# Patient Record
Sex: Female | Born: 1980 | Hispanic: No | Marital: Single | Smoking: Never smoker
Health system: Southern US, Community
[De-identification: ages and names within clinical notes are randomized; demographics above are authoritative.]

## PROBLEM LIST (undated history)

## (undated) DIAGNOSIS — K219 Gastro-esophageal reflux disease without esophagitis: Secondary | ICD-10-CM

## (undated) DIAGNOSIS — A749 Chlamydial infection, unspecified: Secondary | ICD-10-CM

## (undated) DIAGNOSIS — A599 Trichomoniasis, unspecified: Secondary | ICD-10-CM

## (undated) DIAGNOSIS — A64 Unspecified sexually transmitted disease: Secondary | ICD-10-CM

## (undated) DIAGNOSIS — N912 Amenorrhea, unspecified: Secondary | ICD-10-CM

## (undated) DIAGNOSIS — B977 Papillomavirus as the cause of diseases classified elsewhere: Secondary | ICD-10-CM

## (undated) DIAGNOSIS — IMO0002 Reserved for concepts with insufficient information to code with codable children: Secondary | ICD-10-CM

## (undated) DIAGNOSIS — B999 Unspecified infectious disease: Secondary | ICD-10-CM

## (undated) HISTORY — DX: Amenorrhea, unspecified: N91.2

## (undated) HISTORY — DX: Gastro-esophageal reflux disease without esophagitis: K21.9

## (undated) HISTORY — PX: COLPOSCOPY: SHX161

## (undated) HISTORY — DX: Unspecified sexually transmitted disease: A64

## (undated) HISTORY — DX: Reserved for concepts with insufficient information to code with codable children: IMO0002

## (undated) HISTORY — PX: UPPER GASTROINTESTINAL ENDOSCOPY: SHX188

---

## 1998-03-07 ENCOUNTER — Emergency Department (HOSPITAL_COMMUNITY): Admission: EM | Admit: 1998-03-07 | Discharge: 1998-03-07 | Payer: Self-pay | Admitting: Emergency Medicine

## 1998-04-13 ENCOUNTER — Encounter: Admission: RE | Admit: 1998-04-13 | Discharge: 1998-04-13 | Payer: Self-pay | Admitting: Obstetrics

## 1998-07-10 ENCOUNTER — Emergency Department (HOSPITAL_COMMUNITY): Admission: EM | Admit: 1998-07-10 | Discharge: 1998-07-10 | Payer: Self-pay | Admitting: Emergency Medicine

## 1998-07-10 ENCOUNTER — Encounter: Payer: Self-pay | Admitting: Emergency Medicine

## 1998-08-09 ENCOUNTER — Other Ambulatory Visit: Admission: RE | Admit: 1998-08-09 | Discharge: 1998-08-09 | Payer: Self-pay | Admitting: Obstetrics and Gynecology

## 1998-09-13 ENCOUNTER — Encounter (INDEPENDENT_AMBULATORY_CARE_PROVIDER_SITE_OTHER): Payer: Self-pay | Admitting: Specialist

## 1998-09-13 ENCOUNTER — Other Ambulatory Visit: Admission: RE | Admit: 1998-09-13 | Discharge: 1998-09-13 | Payer: Self-pay | Admitting: Obstetrics and Gynecology

## 1999-01-25 ENCOUNTER — Encounter (INDEPENDENT_AMBULATORY_CARE_PROVIDER_SITE_OTHER): Payer: Self-pay

## 1999-01-25 ENCOUNTER — Other Ambulatory Visit: Admission: RE | Admit: 1999-01-25 | Discharge: 1999-01-25 | Payer: Self-pay | Admitting: Obstetrics and Gynecology

## 1999-03-19 DIAGNOSIS — R87619 Unspecified abnormal cytological findings in specimens from cervix uteri: Secondary | ICD-10-CM

## 1999-03-19 DIAGNOSIS — IMO0002 Reserved for concepts with insufficient information to code with codable children: Secondary | ICD-10-CM

## 1999-03-19 HISTORY — DX: Unspecified abnormal cytological findings in specimens from cervix uteri: R87.619

## 1999-03-19 HISTORY — DX: Reserved for concepts with insufficient information to code with codable children: IMO0002

## 1999-03-19 HISTORY — PX: CERVICAL BIOPSY  W/ LOOP ELECTRODE EXCISION: SUR135

## 1999-05-04 ENCOUNTER — Other Ambulatory Visit: Admission: RE | Admit: 1999-05-04 | Discharge: 1999-05-04 | Payer: Self-pay | Admitting: *Deleted

## 1999-05-25 ENCOUNTER — Encounter (INDEPENDENT_AMBULATORY_CARE_PROVIDER_SITE_OTHER): Payer: Self-pay

## 1999-05-25 ENCOUNTER — Other Ambulatory Visit: Admission: RE | Admit: 1999-05-25 | Discharge: 1999-05-25 | Payer: Self-pay | Admitting: *Deleted

## 1999-08-09 ENCOUNTER — Other Ambulatory Visit: Admission: RE | Admit: 1999-08-09 | Discharge: 1999-08-09 | Payer: Self-pay | Admitting: *Deleted

## 1999-11-06 ENCOUNTER — Emergency Department (HOSPITAL_COMMUNITY): Admission: EM | Admit: 1999-11-06 | Discharge: 1999-11-06 | Payer: Self-pay | Admitting: Emergency Medicine

## 2001-08-13 ENCOUNTER — Other Ambulatory Visit: Admission: RE | Admit: 2001-08-13 | Discharge: 2001-08-13 | Payer: Self-pay | Admitting: Obstetrics and Gynecology

## 2001-12-21 ENCOUNTER — Ambulatory Visit (HOSPITAL_COMMUNITY): Admission: RE | Admit: 2001-12-21 | Discharge: 2001-12-21 | Payer: Self-pay | Admitting: *Deleted

## 2002-05-25 ENCOUNTER — Other Ambulatory Visit: Admission: RE | Admit: 2002-05-25 | Discharge: 2002-05-25 | Payer: Self-pay | Admitting: Obstetrics and Gynecology

## 2002-07-08 ENCOUNTER — Encounter: Admission: RE | Admit: 2002-07-08 | Discharge: 2002-07-23 | Payer: Self-pay | Admitting: Occupational Medicine

## 2002-07-23 ENCOUNTER — Emergency Department (HOSPITAL_COMMUNITY): Admission: EM | Admit: 2002-07-23 | Discharge: 2002-07-23 | Payer: Self-pay | Admitting: Emergency Medicine

## 2002-07-30 ENCOUNTER — Encounter: Payer: Self-pay | Admitting: General Practice

## 2002-07-30 ENCOUNTER — Encounter: Admission: RE | Admit: 2002-07-30 | Discharge: 2002-07-30 | Payer: Self-pay | Admitting: General Practice

## 2002-10-14 ENCOUNTER — Other Ambulatory Visit: Admission: RE | Admit: 2002-10-14 | Discharge: 2002-10-14 | Payer: Self-pay | Admitting: Obstetrics and Gynecology

## 2003-04-19 ENCOUNTER — Encounter (INDEPENDENT_AMBULATORY_CARE_PROVIDER_SITE_OTHER): Payer: Self-pay | Admitting: *Deleted

## 2003-04-19 LAB — CONVERTED CEMR LAB

## 2003-05-14 ENCOUNTER — Inpatient Hospital Stay (HOSPITAL_COMMUNITY): Admission: AD | Admit: 2003-05-14 | Discharge: 2003-05-14 | Payer: Self-pay | Admitting: Obstetrics and Gynecology

## 2003-06-08 ENCOUNTER — Other Ambulatory Visit: Admission: RE | Admit: 2003-06-08 | Discharge: 2003-06-08 | Payer: Self-pay | Admitting: Obstetrics and Gynecology

## 2003-07-11 ENCOUNTER — Other Ambulatory Visit: Admission: RE | Admit: 2003-07-11 | Discharge: 2003-07-11 | Payer: Self-pay | Admitting: Obstetrics and Gynecology

## 2003-10-19 ENCOUNTER — Encounter: Admission: RE | Admit: 2003-10-19 | Discharge: 2003-10-19 | Payer: Self-pay | Admitting: Family Medicine

## 2003-11-14 ENCOUNTER — Emergency Department (HOSPITAL_COMMUNITY): Admission: EM | Admit: 2003-11-14 | Discharge: 2003-11-14 | Payer: Self-pay | Admitting: Family Medicine

## 2003-11-20 ENCOUNTER — Emergency Department (HOSPITAL_COMMUNITY): Admission: EM | Admit: 2003-11-20 | Discharge: 2003-11-20 | Payer: Self-pay | Admitting: Emergency Medicine

## 2003-11-22 ENCOUNTER — Emergency Department (HOSPITAL_COMMUNITY): Admission: EM | Admit: 2003-11-22 | Discharge: 2003-11-22 | Payer: Self-pay | Admitting: Family Medicine

## 2003-12-13 ENCOUNTER — Emergency Department (HOSPITAL_COMMUNITY): Admission: EM | Admit: 2003-12-13 | Discharge: 2003-12-13 | Payer: Self-pay | Admitting: Family Medicine

## 2004-01-06 ENCOUNTER — Emergency Department (HOSPITAL_COMMUNITY): Admission: EM | Admit: 2004-01-06 | Discharge: 2004-01-06 | Payer: Self-pay | Admitting: Family Medicine

## 2004-01-26 ENCOUNTER — Other Ambulatory Visit: Admission: RE | Admit: 2004-01-26 | Discharge: 2004-01-26 | Payer: Self-pay | Admitting: Obstetrics and Gynecology

## 2004-05-27 ENCOUNTER — Emergency Department (HOSPITAL_COMMUNITY): Admission: EM | Admit: 2004-05-27 | Discharge: 2004-05-27 | Payer: Self-pay | Admitting: Family Medicine

## 2004-06-22 ENCOUNTER — Emergency Department (HOSPITAL_COMMUNITY): Admission: EM | Admit: 2004-06-22 | Discharge: 2004-06-22 | Payer: Self-pay | Admitting: Family Medicine

## 2004-08-22 ENCOUNTER — Emergency Department (HOSPITAL_COMMUNITY): Admission: EM | Admit: 2004-08-22 | Discharge: 2004-08-22 | Payer: Self-pay | Admitting: Family Medicine

## 2004-09-11 ENCOUNTER — Emergency Department (HOSPITAL_COMMUNITY): Admission: EM | Admit: 2004-09-11 | Discharge: 2004-09-11 | Payer: Self-pay | Admitting: Family Medicine

## 2004-09-25 ENCOUNTER — Ambulatory Visit: Payer: Self-pay | Admitting: Family Medicine

## 2004-11-12 ENCOUNTER — Other Ambulatory Visit: Admission: RE | Admit: 2004-11-12 | Discharge: 2004-11-12 | Payer: Self-pay | Admitting: Obstetrics and Gynecology

## 2005-02-19 ENCOUNTER — Ambulatory Visit: Payer: Self-pay | Admitting: Sports Medicine

## 2005-02-21 ENCOUNTER — Ambulatory Visit: Payer: Self-pay | Admitting: Family Medicine

## 2005-02-23 ENCOUNTER — Emergency Department (HOSPITAL_COMMUNITY): Admission: EM | Admit: 2005-02-23 | Discharge: 2005-02-23 | Payer: Self-pay | Admitting: Family Medicine

## 2005-03-03 ENCOUNTER — Emergency Department (HOSPITAL_COMMUNITY): Admission: EM | Admit: 2005-03-03 | Discharge: 2005-03-03 | Payer: Self-pay | Admitting: Family Medicine

## 2005-05-04 ENCOUNTER — Emergency Department (HOSPITAL_COMMUNITY): Admission: EM | Admit: 2005-05-04 | Discharge: 2005-05-04 | Payer: Self-pay | Admitting: Family Medicine

## 2005-07-08 ENCOUNTER — Emergency Department (HOSPITAL_COMMUNITY): Admission: EM | Admit: 2005-07-08 | Discharge: 2005-07-08 | Payer: Self-pay | Admitting: Family Medicine

## 2005-11-07 ENCOUNTER — Emergency Department (HOSPITAL_COMMUNITY): Admission: EM | Admit: 2005-11-07 | Discharge: 2005-11-07 | Payer: Self-pay | Admitting: Family Medicine

## 2005-11-14 ENCOUNTER — Other Ambulatory Visit: Admission: RE | Admit: 2005-11-14 | Discharge: 2005-11-14 | Payer: Self-pay | Admitting: Obstetrics & Gynecology

## 2006-04-04 ENCOUNTER — Ambulatory Visit: Payer: Self-pay | Admitting: Family Medicine

## 2006-04-04 ENCOUNTER — Encounter: Payer: Self-pay | Admitting: Family Medicine

## 2006-04-04 LAB — CONVERTED CEMR LAB
Cholesterol: 176 mg/dL (ref 0–200)
HDL: 50 mg/dL (ref >39)
LDL Cholesterol: 108 mg/dL — ABNORMAL HIGH (ref 0–99)
Total CHOL/HDL Ratio: 3.5
Triglycerides: 90 mg/dL (ref <150)
VLDL: 18 mg/dL (ref 0–40)

## 2006-05-15 DIAGNOSIS — K219 Gastro-esophageal reflux disease without esophagitis: Secondary | ICD-10-CM | POA: Insufficient documentation

## 2006-05-15 DIAGNOSIS — K5900 Constipation, unspecified: Secondary | ICD-10-CM | POA: Insufficient documentation

## 2006-05-16 ENCOUNTER — Encounter (INDEPENDENT_AMBULATORY_CARE_PROVIDER_SITE_OTHER): Payer: Self-pay | Admitting: *Deleted

## 2006-10-20 ENCOUNTER — Telehealth: Payer: Self-pay | Admitting: *Deleted

## 2007-02-04 ENCOUNTER — Inpatient Hospital Stay (HOSPITAL_COMMUNITY): Admission: RE | Admit: 2007-02-04 | Discharge: 2007-02-07 | Payer: Self-pay | Admitting: Obstetrics and Gynecology

## 2007-04-10 ENCOUNTER — Telehealth: Payer: Self-pay | Admitting: *Deleted

## 2007-04-14 ENCOUNTER — Encounter: Payer: Self-pay | Admitting: Family Medicine

## 2007-04-14 ENCOUNTER — Ambulatory Visit: Payer: Self-pay | Admitting: Family Medicine

## 2007-04-14 DIAGNOSIS — M654 Radial styloid tenosynovitis [de Quervain]: Secondary | ICD-10-CM | POA: Insufficient documentation

## 2007-04-14 LAB — CONVERTED CEMR LAB
Bilirubin Urine: NEGATIVE
Glucose, Urine, Semiquant: NEGATIVE
Ketones, urine, test strip: NEGATIVE
Nitrite: NEGATIVE
Specific Gravity, Urine: 1.02
TSH: 0.885 microintl units/mL (ref 0.350–5.50)
Urobilinogen, UA: 0.2
pH: 6

## 2007-04-16 ENCOUNTER — Telehealth: Payer: Self-pay | Admitting: Family Medicine

## 2007-08-20 ENCOUNTER — Ambulatory Visit: Payer: Self-pay | Admitting: Family Medicine

## 2008-11-26 ENCOUNTER — Encounter (INDEPENDENT_AMBULATORY_CARE_PROVIDER_SITE_OTHER): Payer: Self-pay | Admitting: *Deleted

## 2008-11-26 DIAGNOSIS — F17200 Nicotine dependence, unspecified, uncomplicated: Secondary | ICD-10-CM | POA: Insufficient documentation

## 2009-04-06 ENCOUNTER — Ambulatory Visit: Payer: Self-pay | Admitting: Family Medicine

## 2009-05-22 ENCOUNTER — Ambulatory Visit: Payer: Self-pay | Admitting: Family Medicine

## 2009-05-22 ENCOUNTER — Encounter: Payer: Self-pay | Admitting: Family Medicine

## 2009-05-22 LAB — CONVERTED CEMR LAB
Chlamydia, DNA Probe: NEGATIVE
GC Probe Amp, Genital: NEGATIVE
Whiff Test: NEGATIVE

## 2009-06-15 ENCOUNTER — Ambulatory Visit: Payer: Self-pay | Admitting: Family Medicine

## 2009-06-15 ENCOUNTER — Encounter: Payer: Self-pay | Admitting: Family Medicine

## 2009-06-16 ENCOUNTER — Encounter: Payer: Self-pay | Admitting: Family Medicine

## 2009-06-16 ENCOUNTER — Telehealth (INDEPENDENT_AMBULATORY_CARE_PROVIDER_SITE_OTHER): Payer: Self-pay | Admitting: *Deleted

## 2009-06-19 ENCOUNTER — Telehealth: Payer: Self-pay | Admitting: Family Medicine

## 2009-06-20 ENCOUNTER — Telehealth: Payer: Self-pay | Admitting: *Deleted

## 2009-12-20 ENCOUNTER — Ambulatory Visit (HOSPITAL_COMMUNITY): Payer: Self-pay | Admitting: Psychiatry

## 2010-01-16 ENCOUNTER — Encounter: Payer: Self-pay | Admitting: Family Medicine

## 2010-03-21 ENCOUNTER — Encounter: Payer: Self-pay | Admitting: Family Medicine

## 2010-03-21 ENCOUNTER — Ambulatory Visit
Admission: RE | Admit: 2010-03-21 | Discharge: 2010-03-21 | Payer: Self-pay | Source: Home / Self Care | Attending: Family Medicine | Admitting: Family Medicine

## 2010-03-21 DIAGNOSIS — N760 Acute vaginitis: Secondary | ICD-10-CM | POA: Insufficient documentation

## 2010-03-21 LAB — CONVERTED CEMR LAB: Whiff Test: NEGATIVE

## 2010-03-22 ENCOUNTER — Telehealth: Payer: Self-pay | Admitting: Family Medicine

## 2010-04-19 NOTE — Assessment & Plan Note (Signed)
Summary: cold/?uti,tcb   Vital Signs:  Patient profile:   30 year old female Weight:      167.3 pounds Temp:     98.4 degrees F oral Pulse rate:   80 / minute Pulse rhythm:   regular BP sitting:   132 / 85  (left arm)  Vitals Entered By: Loralee Pacas CMA (June 15, 2009 1:48 PM)  Primary Care Provider:  Luretha Murphy NP   History of Present Illness: 1.  cough--started out as sore throat.  then coughing past 3-4 days.  worse at night.  no fever. no sneezing.  some nasal congestion.  no hx of allergies.  no sick contacts.  no asthma.    2.  ?uti--urinary frequency, dysuria for about a week.  no fevers or abd pain.  has had utis before  Current Medications (verified): 1)  None  Review of Systems General:  Denies fever and loss of appetite. Resp:  Complains of chest pain with inspiration and cough; denies coughing up blood, shortness of breath, and wheezing. GU:  Complains of dysuria, urinary frequency, and urinary hesitancy; denies abnormal vaginal bleeding and hematuria.  Physical Exam  General:  Well-developed,well-nourished,in no acute distress; alert,appropriate and cooperative throughout examination Eyes:  normal appearance Ears:  tms normal Nose:  External nasal examination shows no deformity or inflammation. Nasal mucosa are pink and moist without lesions or exudates. Mouth:  pharyngeal erythema.  mild.  no exudate Neck:  No deformities, masses, or tenderness noted. Lungs:  Normal respiratory effort, chest expands symmetrically. Lungs are clear to auscultation, no crackles or wheezes. Heart:  Normal rate and regular rhythm. S1 and S2 normal without gallop, murmur, click, rub or other extra sounds. Abdomen:  Bowel sounds positive,abdomen soft and non-tender without masses, organomegaly or hernias noted. Additional Exam:  vital signs reviewed    Impression & Recommendations:  Problem # 1:  DYSURIA (ICD-788.1) Assessment New  Orders: Urinalysis-[facility] (00000) [facility]- Est  Level  3 (24401) Urine Culture-[facility] (02725-36644)  Problem # 2:  VIRAL URI (ICD-465.9) Assessment: Deteriorated  benzonatate for daytime cough and tussionex for nighttime cough.  ibuprofen for pleuritic pain.    Orders: [facility]- Est Level  3 (03474)  Her updated medication list for this problem includes:    Benzonatate 200 Mg Caps (Benzonatate) .Marland Kitchen... 1 cap by mouth three times a day as needed cough    Guaifenesin-codeine 300-10 Mg/52ml Liqd (Guaifenesin-codeine) .Marland Kitchen... 5-10 ml by mouth at bedtime as needed cough; dispense qs for one month  Complete Medication List: 1)  Benzonatate 200 Mg Caps (Benzonatate) .Marland Kitchen.. 1 cap by mouth three times a day as needed cough 2)  Guaifenesin-codeine 300-10 Mg/34ml Liqd (Guaifenesin-codeine) .... 5-10 ml by mouth at bedtime as needed cough; dispense qs for one month  Patient Instructions: 1)  It was nice to see you today. 2)  For your cough, take the benzonatate during the day and the guaifensin codeine syrup at night.   3)  Ibuprofen is OK for pain (2-3 pills three times a day with food). 4)  Please schedule a follow-up appointment if you are not getting better.   Prescriptions: BENZONATATE 200 MG CAPS (BENZONATATE) 1 cap by mouth three times a day as needed cough  #90 x 0   Entered and Authorized by:   Asher Muir MD   Signed by:   Asher Muir MD on 06/15/2009   Method used:   [facility]  [address] [phone][facility] ([facility]  [address]       [place], [place]  [id number]       Ph: [phone] or [phone]       Fax: [phone]   RxID:   [id number] GUAIFENESIN-CODEINE 300-10 MG/5ML LIQD (GUAIFENESIN-CODEINE) 5-10 mL by mouth at bedtime as needed cough; dispense qs for one month  #1 x 0   Entered and Authorized by:   Asher Muir MD   Signed by:   Asher Muir MD on 06/15/2009   Method used:   Print then Give to Patient   RxID:   [id number]   Appended Document:  Urinalysis results  Laboratory Results   Urine Tests  Date/Time Received: June 15, 2009 2:04 PM  Date/Time Reported: June 15, 2009 2:16 PM   Routine Urinalysis   Color: yellow Appearance: Clear Glucose: negative   (Normal Range: Negative) Bilirubin: negative   (Normal Range: Negative) Ketone: negative   (Normal Range: Negative) Spec. Gravity: 1.020   (Normal Range: 1.003-1.035) Blood: negative   (Normal Range: Negative) pH: 7.5   (Normal Range: 5.0-8.0) Protein: trace   (Normal Range: Negative) Urobilinogen: 1.0   (Normal Range: 0-1) Nitrite: negative   (Normal Range: Negative) Leukocyte Esterace: negative   (Normal Range: Negative)    Comments: ...........test performed by...........Marland KitchenTerese Door, CMA

## 2010-04-19 NOTE — Progress Notes (Signed)
  Phone Note Outgoing Call   Call placed by: Asher Muir MD,  June 19, 2009 1:41 PM Summary of Call: called and left mssg for pt about urine culture results.  if she is still having symptoms should call me back and we can give short course of abx.  if symptoms are resolved.  then no action is needed Initial call taken by: Asher Muir MD,  June 19, 2009 1:42 PM

## 2010-04-19 NOTE — Miscellaneous (Signed)
  Clinical Lists Changes  Problems: Removed problem of CONTACT OR EXPOSURE TO OTHER VIRAL DISEASES (ICD-V01.79) Removed problem of VIRAL URI (ICD-465.9) Removed problem of WRIST PAIN, RIGHT (ICD-719.43) Removed problem of DYSURIA (ICD-788.1)

## 2010-04-19 NOTE — Assessment & Plan Note (Signed)
Summary: dizzy/fever,df   Vital Signs:  Patient profile:   30 year old female Weight:      166 pounds Temp:     98.1 degrees F oral Pulse rate:   70 / minute BP sitting:   111 / 73  (right arm) Cuff size:   regular  Vitals Entered By: Tessie Fass CMA (April 06, 2009 10:12 AM) CC: cough and congestion x 5 days. feeling dizzy x 4 days Is Patient Diabetic? No Pain Assessment Patient in pain? no        Primary Care Provider:  Luretha Murphy NP  CC:  cough and congestion x 5 days. feeling dizzy x 4 days.  History of Present Illness: cc: cough, congestion x 5 days  30 y/o F with cough, congestion x 5 days.  Pt taking Dayquil, Nitequil, and AlkaSeltzer Cold with some relief.  She is here because she is experiencing "dizziness".  No room spinning, no blurry vision, no HAs, no nausea, no vomiting, no chills.    Habits & Providers  Alcohol-Tobacco-Diet     Tobacco Status: never  Social History: Smoking Status:  never  Physical Exam  General:  Well-developed,well-nourished,in no acute distress; alert,appropriate and cooperative throughout examination. vitals reviewed.   Head:  normocephalic and atraumatic.  pt endorsed relief of pressure to bakc of head (posterior occipital area) Ears:  External ear exam shows no significant lesions or deformities.  Otoscopic examination reveals clear canals, tympanic membranes are intact bilaterally without bulging, retraction, inflammation or discharge. Hearing is grossly normal bilaterally. Mouth:  Oral mucosa and oropharynx without lesions or exudates.  Teeth in good repair. Lungs:  Normal respiratory effort, chest expands symmetrically. Lungs are clear to auscultation, no crackles or wheezes. Heart:  Normal rate and regular rhythm. S1 and S2 normal without gallop, murmur, click, rub or other extra sounds. Abdomen:  Bowel sounds positive,abdomen soft and non-tender without masses, organomegaly or hernias noted. Skin:  Intact without  suspicious lesions or rashes Cervical Nodes:  No lymphadenopathy noted   Impression & Recommendations:  Problem # 1:  VIRAL URI (ICD-465.9) Assessment New Symptoms of what pt refers to as "dizziness" is more like pressure/congestion.  Advised oct sudafed  and tylenol/ibuprofen for symptomatic relief of pain/fever.  Advised increased fluid intake.  Pt to rtc in 1 wk if not better.   Orders: [facility]- Est Level  3 (41324)  Patient Instructions: 1)  Please schedule a follow-up appointment as needed.  2)  You can try Sudafed for your head congestion. 3)  For your heartburn, please try to refrain from acidic foods (orange juice, tomatoes, coffee, etc).

## 2010-04-19 NOTE — Assessment & Plan Note (Signed)
Summary: vag discharge/eo   Vital Signs:  Patient profile:   30 year old female Height:      67 inches Weight:      179.25 pounds BMI:     28.18 Temp:     98.8 degrees F oral Pulse rate:   69 / minute BP sitting:   135 / 70  (right arm)  Vitals Entered By: Terese Door (March 21, 2010 8:49 AM) CC: vaginal discharge Is Patient Diabetic? No   Primary Care Provider:  Luretha Murphy NP  CC:  vaginal discharge.  History of Present Illness:    Vaginal discharge x 3 days, Dark yellow disharge, +pruritis,  +abd cramping, Mirena in place , no dysuria no fever, occ nausea, no emesis, appt decreased slightly, gets cramping monthly with Mirena Husband has sexual relationships outside of marriage, she does not use protection with husband  Wants STD check    Needs refill on Omeprazole   Habits & Providers  Alcohol-Tobacco-Diet     Tobacco Status: never  Current Medications (verified): 1)  Omeprazole 20 Mg Cpdr (Omeprazole) .Marland Kitchen.. 1 By Mouth Daily For Heartburn  Allergies (verified): No Known Drug Allergies  Review of Systems       Per HPI  Physical Exam  General:  Well-developed,well-nourished,in no acute distress; alert,appropriate and cooperative throughout examination Vital signs noted  Abdomen:  soft, non-tender, normal bowel sounds, no distention, and no masses.   Genitalia:  normal introitus, no external lesions, mucosa pink and moist, and no vaginal or cervical lesions.  Thick yellow tinted discharge, mild bleeding from os with swab, no CMT, no ovarian masses felt, uterus normal shape, IUD strings visable   Impression & Recommendations:  Problem # 1:  VAGINITIS (ICD-616.10) Assessment New Wet prep normal, will await, GC/Chlamydia as some white cells seen. If negative, will give option of OTC Rephresh for pH balance. The following medications were removed from the medication list:    Cipro 500 Mg Tabs (Ciprofloxacin hcl) .Marland Kitchen... 1 tab by mouth two times a day for 3  days for urinary infection; dispense generic  Orders: GC/Chlamydia-[facility] (87591/87491) Wet Prep- [facility] (21308) [facility]- Est Level  3 (65784)  Problem # 2:  SEXUALLY TRANSMITTED DISEASE, EXPOSURE TO (ICD-V01.6) Assessment: New Check GC/Chlamydia per above, HIV, RPR Orders: RPR-[facility] (69629-52841) HIV-[facility] (32440-10272) [facility]- Est Level  3 (53664)  Complete Medication List: 1)  Omeprazole 20 Mg Cpdr (Omeprazole) .Marland Kitchen.. 1 by mouth daily for heartburn  Patient Instructions: 1)  I will call regarding the remainder of your labs Prescriptions: OMEPRAZOLE 20 MG CPDR (OMEPRAZOLE) 1 by mouth daily for heartburn  #31 x 3   Entered and Authorized by:   Milinda Antis MD   Signed by:   Milinda Antis MD on 03/21/2010   Method used:   Handwritten   RxID:   [id number]    Orders Added: 1)  GC/Chlamydia-[facility] [87591/87491] 2)  Wet Prep- [facility] [87210] 3)  RPR-[facility] [43329-51884] 4)  HIV-[facility] [16606-30160] 5)  [facility]- Est Level  3 [10932]    Laboratory Results  Date/Time Received: March 21, 2010 9:11 AM  Date/Time Reported: March 21, 2010 9:44 AM   Wet Mount Source: vag WBC/hpf: 1-5 Bacteria/hpf: 3+  Rods Clue cells/hpf: none  Negative whiff Yeast/hpf: none Trichomonas/hpf: none Comments: ...............test performed by......Marland KitchenBonnie A. Swaz[place], MLS (ASCP)cm

## 2010-04-19 NOTE — Progress Notes (Signed)
Summary: Rx Prob  Phone Note Call from Patient Call back at [phone]   Caller: Patient Summary of Call: Pt calling because the cough medication that did not have a narcotic in it did not get to the pharmacy.  [facility] [address] Initial call taken by: Clydell Hakim,  June 16, 2009 8:53 AM  Follow-up for Phone Call        [facility] [address]. patient notified and she will go there to pick up. Follow-up by: Theresia Lo RN,  June 16, 2009 8:59 AM

## 2010-04-19 NOTE — Assessment & Plan Note (Signed)
Summary: STD CK,DF   Vital Signs:  Patient profile:   30 year old female Height:      67 inches Weight:      164 pounds BMI:     25.78 Temp:     98.2 degrees F oral Pulse rate:   77 / minute BP sitting:   117 / 79  (right arm) Cuff size:   regular  Vitals Entered By: Tessie Fass CMA (May 22, 2009 11:19 AM) CC: STD Check   Primary Care Provider:  Luretha Murphy NP  CC:  STD Check.  History of Present Illness: Has intercourse with estranged husband who is sexually active with other woman.  Wants to be checked for all STDs.  Physical Exam  General:  Well-developed,well-nourished,in no acute distress; alert,appropriate and cooperative throughout examination Genitalia:  Normal introitus for age, no external lesions, no vaginal discharge, mucosa pink and moist, no vaginal or cervical lesions, no vaginal atrophy, no friaility or hemorrhage, normal uterus size and position, no adnexal masses or tenderness   Impression & Recommendations:  Problem # 1:  CONTACT OR EXPOSURE TO OTHER VIRAL DISEASES (ICD-V01.79)  Orders: GC/Chlamydia-[facility] (87591/87491) Wet Prep- [facility] ([address]109) [facility]- Est Level  3 (60454)  Laboratory Results  Date/Time Received: May 22, 2009 11:30 AM  Date/Time Reported: May 22, 2009 11:45 AM   [employer] [place] Source: vaginal WBC/hpf: 1-5 Bacteria/hpf: 3+  Rods Clue cells/hpf: none  Negative whiff Yeast/hpf: none Trichomonas/hpf: none Comments: ...........test performed by...........Marland KitchenTerese Door, CMA      Prevention & Chronic Care Immunizations   Influenza vaccine: Not documented    Tetanus booster: 02/15/2005: Done.   Tetanus booster due: 02/16/2015    Pneumococcal vaccine: Not documented  Other Screening   Pap smear: Done.  (04/19/2003)   Pap smear due: 04/18/2004   Smoking status: never  (04/06/2009)

## 2010-04-19 NOTE — Assessment & Plan Note (Signed)
Summary: contact number  cell [phone].  ok to leave mssg

## 2010-04-19 NOTE — Letter (Signed)
Summary: Out of Work  [facility]  [address]   [place], [place] [address]   Phone: [phone]  Fax: [phone]    March 21, 2010   Employee:  ENYAH MOMAN    To Whom It May Concern:   For Medical reasons, please excuse the above named employee from tardiness to work for the following dates:  Start:   Mar 21, 2010  End:   Mar 21, 2010  If you need additional information, please feel free to contact our office.         Sincerely,    Milinda Antis MD

## 2010-04-19 NOTE — Progress Notes (Signed)
Summary: RX  Phone Note Call from Patient Call back at [phone]   Reason for Call: Talk to Nurse Summary of Call: pt calling to report she is still experiencing symptoms from last visit and would like antibiotics prescribed, needs to be called into [facility] [place], [place] and their number is [phone] Initial call taken by: Knox Royalty,  June 20, 2009 9:14 AM  Follow-up for Phone Call        No antibioics over he phone, give it 10 days and come in if no better. Follow-up by: Luretha Murphy NP,  June 20, 2009 10:35 AM  Additional Follow-up for Phone Call Additional follow up Details #1::        pt states she spoke with Dr. Lafonda Mosses yesterday & was told if she still has UTI symptoms, she would call antibiotics in. message to Dr. Freddy Jaksch Additional Follow-up by: Golden Circle RN,  June 20, 2009 12:32 PM    Additional Follow-up for Phone Call Additional follow up Details #2::    Her symptoms were pretty consistent with uti; so if she is still having them I would favor treatment if that is OK with Darl Pikes.  unfortunately, I cannot find her pharmacy in the system.  Would you mind sending it in?  Thanks! Follow-up by: Asher Muir MD,  June 20, 2009 12:43 PM  Additional Follow-up for Phone Call Additional follow up Details #3:: Details for Additional Follow-up Action Taken: pt informed. states [facility]. told her I will call & get it sent there. [facility] pharmacies are closed until 2pm. will try after that Additional Follow-up by: Golden Circle RN,  June 20, 2009 1:46 PM  New/Updated Medications: CIPRO 500 MG TABS (CIPROFLOXACIN HCL) 1 tab by mouth two times a day for 3 days for urinary infection; dispense generic Prescriptions: CIPRO 500 MG TABS (CIPROFLOXACIN HCL) 1 tab by mouth two times a day for 3 days for urinary infection; dispense generic  #6 x 0   Entered and Authorized by:   Asher Muir MD   Signed by:   Asher Muir MD on 06/20/2009   Method used:   Electronically to        [facility] [address] [phone]* (retail)       [address]       [place], [place]  [address]       Ph: [phone]       Fax: [phone]   RxID:   [id number]  called it in.Golden Circle RN  June 20, 2009 2:54 PM

## 2010-04-19 NOTE — Progress Notes (Signed)
Summary: Lab results-LVM- Nurse can give results  Phone Note Outgoing Call   Call placed by: Milinda Antis MD,  March 22, 2010 12:55 PM Details for Reason: Lab results Summary of Call:  LVM for pt to call back for lab results Please give patient message, her Gonorrhea/Chlamydia was negative, her HIV and Syphillis test were negative

## 2010-04-26 ENCOUNTER — Encounter: Payer: Self-pay | Admitting: *Deleted

## 2010-07-23 ENCOUNTER — Emergency Department (HOSPITAL_COMMUNITY): Payer: BC Managed Care – PPO

## 2010-07-23 ENCOUNTER — Emergency Department (HOSPITAL_COMMUNITY)
Admission: EM | Admit: 2010-07-23 | Discharge: 2010-07-24 | Disposition: A | Discharge status: Home / Self Care | Payer: BC Managed Care – PPO | Attending: Emergency Medicine | Admitting: Emergency Medicine

## 2010-07-23 DIAGNOSIS — R071 Chest pain on breathing: Secondary | ICD-10-CM | POA: Insufficient documentation

## 2010-07-23 DIAGNOSIS — Z79899 Other long term (current) drug therapy: Secondary | ICD-10-CM | POA: Insufficient documentation

## 2010-07-23 DIAGNOSIS — N644 Mastodynia: Secondary | ICD-10-CM | POA: Insufficient documentation

## 2010-07-23 DIAGNOSIS — R110 Nausea: Secondary | ICD-10-CM | POA: Insufficient documentation

## 2010-07-23 DIAGNOSIS — K219 Gastro-esophageal reflux disease without esophagitis: Secondary | ICD-10-CM | POA: Insufficient documentation

## 2010-07-23 LAB — POCT CARDIAC MARKERS: Myoglobin, poc: 41.2 ng/mL (ref 12–200)

## 2010-07-23 LAB — DIFFERENTIAL
Eosinophils Relative: 1 % (ref 0–5)
Lymphocytes Relative: 33 % (ref 12–46)
Lymphs Abs: 2.9 10*3/uL (ref 0.7–4.0)
Monocytes Absolute: 0.6 10*3/uL (ref 0.1–1.0)
Monocytes Relative: 7 % (ref 3–12)

## 2010-07-23 LAB — CBC
HCT: 37.2 % (ref 36.0–46.0)
Hemoglobin: 12.9 g/dL (ref 12.0–15.0)
MCH: 29.5 pg (ref 26.0–34.0)
MCHC: 34.7 g/dL (ref 30.0–36.0)
MCV: 84.9 fL (ref 78.0–100.0)
RDW: 13.3 % (ref 11.5–15.5)

## 2010-07-23 LAB — BASIC METABOLIC PANEL
CO2: 26 mEq/L (ref 19–32)
Calcium: 9.5 mg/dL (ref 8.4–10.5)
GFR calc Af Amer: >60 mL/min (ref >60)
Potassium: 4 mEq/L (ref 3.5–5.1)
Sodium: 138 mEq/L (ref 135–145)

## 2010-07-31 ENCOUNTER — Ambulatory Visit (INDEPENDENT_AMBULATORY_CARE_PROVIDER_SITE_OTHER): Payer: BC Managed Care – PPO | Admitting: Family Medicine

## 2010-07-31 ENCOUNTER — Encounter: Payer: Self-pay | Admitting: Family Medicine

## 2010-07-31 VITALS — BP 118/78 | HR 66 | Temp 98.4°F | Ht 67.0 in | Wt 170.0 lb

## 2010-07-31 DIAGNOSIS — Z136 Encounter for screening for cardiovascular disorders: Secondary | ICD-10-CM

## 2010-07-31 DIAGNOSIS — K219 Gastro-esophageal reflux disease without esophagitis: Secondary | ICD-10-CM

## 2010-07-31 LAB — LIPID PANEL
Cholesterol: 135 mg/dL (ref 0–200)
HDL: 36 mg/dL — ABNORMAL LOW (ref >39)
Triglycerides: 68 mg/dL (ref <150)
VLDL: 14 mg/dL (ref 0–40)

## 2010-07-31 MED ORDER — ESOMEPRAZOLE MAGNESIUM 40 MG PO CPDR: 40.0000 mg | DELAYED_RELEASE_CAPSULE | Freq: Every day | ORAL | Status: DC

## 2010-07-31 MED ORDER — OMEPRAZOLE 40 MG PO CPDR: 40.0000 mg | DELAYED_RELEASE_CAPSULE | Freq: Every day | ORAL | Status: DC

## 2010-07-31 NOTE — Progress Notes (Signed)
Addended by: Nilda Simmer on: 07/31/2010 04:35 PM   Modules accepted: Orders

## 2010-07-31 NOTE — Patient Instructions (Signed)
Begin Nexium 40 mg daily, you should notice a difference in 1-2 week I will get your lipids and send you a letter Begin to change you diet: more water( 6-8 glasses), more whole foods, less red meat Do not lay down after eating If this does not get better let me know  Diet for GERD or PUD Nutrition therapy can help ease the discomfort of gastroesophageal reflux disease (GERD) and peptic ulcer disease (PUD).  HOME CARE INSTRUCTIONS  Eat your meals slowly, in a relaxed setting.   Eat 5 to 6 small meals per day.   If a food causes distress, stop eating it for a period of time.  FOODS TO AVOID:  Coffee, regular or decaffeinated.  Cola beverages, regular or low calorie.   Tea, regular or decaffeinated.   Pepper.   Cocoa.   High fat foods including meats.   Butter, margarine, hydrogenated oil (trans fats).  Peppermint or spearmint (if you have GERD).   Fruits and vegetables as tolerated.   Alcoholic beverages.   Nicotine (smoking or chewing). This is one of the most potent stimulants to acid production in the gastrointestinal tract.   Any food that seems to aggravate your condition.   If you have questions regarding your diet, call your caregiver's office or a registered dietitian. OTHER TIPS IF YOU HAVE GERD:  Lying flat may make symptoms worse. Keep the head of your bed raised 6 to 9 inches by using a foam wedge or blocks under the legs of the bed.   Do not lay down until 3 hours after eating a meal.   Daily physical activity may help reduce symptoms.  MAKE SURE YOU:   Understand these instructions.   Will watch your condition.   Will get help right away if you are not doing well or get worse.  Document Released: 03/04/2005 Document Re-Released: 07/21/2008 [facility] Patient Information 2011 [place], [place].

## 2010-07-31 NOTE — Assessment & Plan Note (Addendum)
Common things being common will start with regular use of PPI, she has been taking omeprazole prn (Nexium preferred on her plan) and increase to 40 mg.  She will be more conscious of her po intake and if it contributes, while recognizing her diet is high is fat, sugar and carbs.  If this persists for 7-10 more days will proceed with RUQ ultrasound.  Consider depression. Labs and notes from ER were reviewed.

## 2010-07-31 NOTE — Progress Notes (Signed)
  Subjective:    Patient ID: Natalie Day, female    DOB: 10/12/1980, 30 y.o.   MRN: [id number]  HPI 2 weeks of sharp, intermittent, brief pain in the chest/right upper quadrant region.  Went to the ER last week and work up was negative for PE, MI, or abnormal labs.  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  Bad diet, sodas, coffee, fast food.  Has not gained much weight lately.  Cannot like the pain to anything she does.  Denies exertional related, occasionally occurring during the night but primarily during the day.  The ER told her to get a mammogram.   Review of Systems  Constitutional: Negative for fever, diaphoresis, activity change, appetite change, fatigue and unexpected weight change.  Respiratory: Negative for cough, shortness of breath and wheezing.   Cardiovascular: Positive for chest pain. Negative for palpitations and leg swelling.  Gastrointestinal: Positive for abdominal pain. Negative for nausea, vomiting, constipation and abdominal distention.  Genitourinary: Negative for dysuria.  Psychiatric/Behavioral: Negative for dysphoric mood.       Objective:   Physical Exam  Constitutional: She appears well-developed and well-nourished.  Pulmonary/Chest: Effort normal and breath sounds normal. She exhibits tenderness.       Very tight bra with deep underwire marks on chest wall, tender all along the costochondral margins.  Lungs clear.  Breasts no masses, very dense, no axillary adenopathy.  Abdominal: Soft. Bowel sounds are normal. She exhibits no distension. There is tenderness. There is no guarding.       + epigastric tenderness, negative Murphys  Psychiatric: She has a normal mood and affect.          Assessment & Plan:

## 2010-07-31 NOTE — Op Note (Signed)
Natalie Day, BARK NO.:  [id number]   MEDICAL RECORD NO.:  [id number]          PATIENT TYPE:  INP   LOCATION:  9124                          FACILITY:  [facility]   PHYSICIAN:  Malva Limes, M.D.    DATE OF BIRTH:  09/03/1980   DATE OF PROCEDURE:  02/04/2007  DATE OF DISCHARGE:                               OPERATIVE REPORT   PREOPERATIVE DIAGNOSES:  1. Intrauterine pregnancy at term.  2. Suspected macrosomia.   POSTOPERATIVE DIAGNOSES:  1. Intrauterine pregnancy at term.  2. Suspected macrosomia.   PROCEDURE:  Primary low transverse cesarean section.   SURGEON:  Malva Limes, M.D.   ASSISTANT:  Luvenia Redden, M.D.   ANESTHESIA:  Spinal.   ANTIBIOTICS:  Ancef 1 g.   ESTIMATED BLOOD LOSS:  900 mL.   COMPLICATIONS:  None.   FINDINGS:  Patient delivered one live, viable black female infant,  weighing 10 pounds 7 ounces.  Apgars were 9 at one minute, 9 at five  minutes.  Patient had normal fallopian tubes and ovaries bilaterally.  The uterus appeared to be normal.   DESCRIPTION OF PROCEDURE:  The patient was taken to the operating room,  where a spinal anesthetic was administered without difficulty.  She was  then placed in dorsal supine position with a left lateral tilt.  The  patient was prepped with Betadine and a Foley catheter was placed.  The  patient was then directed in the usual fashion for this procedure.  A  Pfannenstiel incision was made.  Once the parietal peritoneum was  entered, the incision was taken superiorly and inferiorly.  The bladder  flap was taken down sharply.  A low transverse uterine incision was made  in the midline.  Amniotic fluid was noted to be clear.  The infant was  delivered in the vertex presentation with a vacuum extractor.  On  delivery of the head, the oropharynx and nostrils were bulb-suctioned.  The remaining infant was then delivered.  The cord was doubly clamped  and cut and the infant handed to the waiting NICU  team.   Placenta was then manually removed.  It appeared to be normal.  The  uterus was exteriorized.  Uterine cavity was cleaned with a wet lap and  inspected.  It appeared to be normal.  The uterine incision was closed  with a single layer of 0 Monocryl suture in running, locking fashion.  The bladder flap was closed, using 2-0 Monocryl in a running fashion.  The uterus was placed back into the abdominal cavity.  Hemostasis was  checked and found to be adequate.  The parietal peritoneum and rectus  muscles were reapproximated in the midline, using 0 Monocryl suture in  running fashion.  The fascia was closed using 0 Monocryl suture in a  running fashion.  Subcuticular tissue was made hemostatic with the  Bovie.  Stainless steel clips were used to close the skin.   The patient tolerated the procedure well.  She was taken to the recovery  room in stable condition.  Instrument and lap counts were correct times  two.  ______________________________  Malva Limes, M.D.     MA/MEDQ  D:  02/04/2007  T:  02/04/2007  Job:  [id number]

## 2010-08-01 ENCOUNTER — Encounter: Payer: Self-pay | Admitting: Family Medicine

## 2010-08-03 NOTE — Discharge Summary (Signed)
NAMEWENDY, HOBACK NO.:  [id number]   MEDICAL RECORD NO.:  [id number]          PATIENT TYPE:  INP   LOCATION:  9124                          FACILITY:  [facility]   PHYSICIAN:  Carrington Clamp, M.D. DATE OF BIRTH:  06/10/1980   DATE OF ADMISSION:  02/04/2007  DATE OF DISCHARGE:  02/07/2007                               DISCHARGE SUMMARY   FINAL DIAGNOSES:  1. Intrauterine pregnancy at term.  2. Macrosomia.   PROCEDURE:  Primary low transverse cesarean section.  Surgeon:  Dr. Malva Limes.  Assistant:  Dr. Tammy Sours.  Complications:  None.   This 30 year old, G2, P0-0-1-0, presents at term for primary cesarean  section secondary to suspected macrosomia.  The patient's antepartum  course up to this point had been mostly uncomplicated.  She did have  some mild gestational thrombocytopenia that was being monitored.  She  also did have a did have a significant weight gain during this  pregnancy, and she had a normal 1-hour sugar test. Otherwise, antepartum  course had been uncomplicated.  She was admitted at this time for  cesarean section secondary to this suspected macrosomia at term.  The  patient was taken to the operating room on February 04, 2007, by Dr.  Malva Limes where primary low transverse cesarean section was  performed with the delivery of a 10 pound 7 ounce female infant with  Apgars of 9/9.  Delivery went without complications.   The patient's postoperative course was benign without any significant  fevers.  She did have gestational thrombocytopenia noted, and labs were  followed. Baby was circumcised before discharge. The patient was felt  ready for discharge on postoperative day #3. She was sent home on a  regular diet, told to decrease her activities, told to continue her  prenatal vitamins and iron supplement once daily.  She did receive Depo-  Provera before discharge but needs the next one in February. She was  given Percocet one to two  every 4 hours as needed for pain, was to  follow up in our office in 4 weeks, and instructions and precautions  were reviewed with the patient.  Labs:  On discharge, the patient had a  hemoglobin of 9.3 white blood cell count of 10.8, platelets of 86,000.      Leilani Able, P.A.-C.      Carrington Clamp, M.D.  Electronically Signed    MB/MEDQ  D:  03/06/2007  T:  03/07/2007  Job:  [id number]

## 2010-11-12 ENCOUNTER — Inpatient Hospital Stay (INDEPENDENT_AMBULATORY_CARE_PROVIDER_SITE_OTHER)
Admission: RE | Admit: 2010-11-12 | Discharge: 2010-11-12 | Disposition: A | Discharge status: Home / Self Care | Payer: BC Managed Care – PPO | Source: Ambulatory Visit | Attending: Family Medicine | Admitting: Family Medicine

## 2010-11-12 DIAGNOSIS — N739 Female pelvic inflammatory disease, unspecified: Secondary | ICD-10-CM

## 2010-11-12 LAB — POCT URINALYSIS DIP (DEVICE)
Ketones, ur: NEGATIVE mg/dL
Leukocytes, UA: NEGATIVE
Protein, ur: NEGATIVE mg/dL
Specific Gravity, Urine: 1.02 (ref 1.005–1.030)
Urobilinogen, UA: 1 mg/dL (ref 0.0–1.0)
pH: 7 (ref 5.0–8.0)

## 2010-11-12 LAB — WET PREP, GENITAL: Clue Cells Wet Prep HPF POC: NONE SEEN

## 2010-11-13 LAB — GC/CHLAMYDIA PROBE AMP, GENITAL: Chlamydia, DNA Probe: NEGATIVE

## 2010-12-25 LAB — CBC
HCT: 26.7 — ABNORMAL LOW
Hemoglobin: 9.3 — ABNORMAL LOW
Hemoglobin: 9.8 — ABNORMAL LOW
MCV: 89
Platelets: 98 — ABNORMAL LOW
RBC: 3 — ABNORMAL LOW
RBC: 3.17 — ABNORMAL LOW
WBC: 10.8 — ABNORMAL HIGH
WBC: 10.9 — ABNORMAL HIGH
WBC: 12.5 — ABNORMAL HIGH

## 2010-12-25 LAB — RPR: RPR Ser Ql: NONREACTIVE

## 2011-02-13 ENCOUNTER — Encounter: Payer: Self-pay | Admitting: Family Medicine

## 2011-02-13 ENCOUNTER — Ambulatory Visit (INDEPENDENT_AMBULATORY_CARE_PROVIDER_SITE_OTHER): Payer: BC Managed Care – PPO | Admitting: Family Medicine

## 2011-02-13 VITALS — BP 125/81 | HR 72 | Temp 98.8°F | Ht 67.0 in | Wt 172.0 lb

## 2011-02-13 DIAGNOSIS — H109 Unspecified conjunctivitis: Secondary | ICD-10-CM

## 2011-02-13 MED ORDER — BACITRACIN-POLYMYXIN B 500-10000 UNIT/GM OP OINT
TOPICAL_OINTMENT | Freq: Four times a day (QID) | OPHTHALMIC | Status: AC
Start: 2011-02-13 — End: 2011-02-20

## 2011-02-13 NOTE — Assessment & Plan Note (Addendum)
Likely viral in etiology in setting of underlying viral URI (not bronchitis). However, will cover empirically with polysporin ophthalmic given subjective purulent drainage. Discussed red flags for reevaluation including vision loss, eye pain, fever, or photophobia. Handout given. Pt agreeable to plan.

## 2011-02-13 NOTE — Progress Notes (Signed)
  Subjective:    Patient ID: Natalie Day, female    DOB: [birth year], 30 y.o.   MRN: [id number]  HPI Conjunctivitis x2 days. Patient recently returned from [place] after Thanksgiving break were patient was seen in one of the pharmacy [facility] and prescribed amoxicillin for a presumed bronchitis. Patient states she noticed eye redness yesterday. Patient states the redness is primary in the in left eye. Patient also noticed some purulent drainage of the left eye. As of this morning, patient states there was some involvement in right eye as well. Patient states that both eyes were stuck shut this morning. Patient denies any headache, eye pain, vision loss, dizziness. No fever.  Review of Systems See HPI ,otherwise 12 point ROS negative.     Objective:   Physical Exam  Eyes:         No limbic involvement bilaterally. No photophobia EOMI Visual Acuity WNL   Gen: in bed, NAD HEENT: TMs clear bilaterally, mild nasal erythema bilaterally, No cervical LAD CV: RRR PULM: CTAB, no wheezes.     Assessment & Plan:

## 2011-08-11 ENCOUNTER — Other Ambulatory Visit: Payer: Self-pay | Admitting: Family Medicine

## 2011-08-13 ENCOUNTER — Telehealth: Payer: Self-pay | Admitting: *Deleted

## 2011-08-13 NOTE — Telephone Encounter (Signed)
PA needed for Nexium. Form placed in MD box.

## 2011-08-14 ENCOUNTER — Encounter: Payer: Self-pay | Admitting: *Deleted

## 2011-08-14 NOTE — Telephone Encounter (Signed)
Approval received from insurance.  Pharmacy notified. 

## 2011-08-14 NOTE — Telephone Encounter (Signed)
This encounter was created in error - please disregard.

## 2011-08-14 NOTE — Telephone Encounter (Signed)
Form completed by Dr. Alvester Morin and faxed to insurance.

## 2011-09-17 ENCOUNTER — Ambulatory Visit (INDEPENDENT_AMBULATORY_CARE_PROVIDER_SITE_OTHER): Payer: BC Managed Care – PPO | Admitting: Family Medicine

## 2011-09-17 ENCOUNTER — Encounter: Payer: Self-pay | Admitting: Family Medicine

## 2011-09-17 VITALS — BP 124/82 | HR 69 | Ht 67.0 in | Wt 179.0 lb

## 2011-09-17 DIAGNOSIS — G44209 Tension-type headache, unspecified, not intractable: Secondary | ICD-10-CM

## 2011-09-17 NOTE — Patient Instructions (Signed)
Dear Mrs. Derryl Harbor,   Thank you for coming to clinic today. Please read below regarding the issues that we discussed.   Headaches - I suggest taking over the counter medication like advil or tylenol. Also, please start a headache journal that record tthe time and date of headaches and what your were doing when they occurred, how long it lasted, and what needed to be done to make it go away.   Please follow up in clinic in 6 weeks . Please call earlier if you have any questions or concerns.   Sincerely,   Dr. Clinton Sawyer

## 2011-09-17 NOTE — Progress Notes (Signed)
  Subjective:    Patient ID: Natalie Day, female    DOB: Mar 29, 1980, 31 y.o.   MRN: [id number]  HPI  HEADACHE: Location:multiple sites - left frontal and right occipital   Quality:sharp, shocking lasting a few seconds;   Onset: first noticed a few months ago, happens infrequently but 3-4 times in one day this weekend  Duration of headache without treatment: Current Frequency:a few times a month  Longest duration without headache: long duration, never had a migraine  Accompanying symptoms: no changes in vision, no nausea, no vomiting, no photophobia, no phonophobia, no lacrimation, no rhinorrhea, yes worsened with activity, no neurologic symptoms Effective treatment:none  Ineffective treatment:none  History of headaches: infrequent  History of imaging:none  Known triggers:none  History of headache journal: no  Suspected Etiology: 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     Review of Systems See HPI     Objective:   Physical Exam BP 124/82  Pulse 69  Ht 5\' 7"  (1.702 m)  Wt 179 lb (81.194 kg)  BMI 28.04 kg/m2 Gen: alert and oriented, well appearing, non-distressed female HEENT: NCAT, no tenderness to palpation along scalp; EOMI, PERRLA, OP clear and moist MSK: normal ROM of cervical spine, no rigidity or meningismus  CRANIAL NERVES: 2 (Optic): visual fields intact to confrontation; funduscopic exam quality diminished by lack of dilation  3,4,6 (EOM): pupils equal round and reactive; extra ocular movement intact 5 (Trigeminal):  no facial numbness; muscles of mastication are intact 7 (Facial): no facial weakness or asymmetry  8 (Auditory): intact 9,10 (Glossopharyngeal/Vagus): uvula is intact midline; palate elevate symmetrically 11 (Spinal Accessory): normal sternocleidomastoid and trapezius strength 12 (Hypoglossal) tongue is midline; no fasciculations or atrophy  MOTOR: Muscle Strength: Upper Extremity: 5/5, Grip: 5/5 Muscle Tone:  normal  REFLEXES: DTR 2+ biceps tendon bilaterally        Assessment & Plan:  31 year old with primary headache.

## 2011-09-18 DIAGNOSIS — G44209 Tension-type headache, unspecified, not intractable: Secondary | ICD-10-CM | POA: Insufficient documentation

## 2011-09-18 NOTE — Assessment & Plan Note (Signed)
Liklyl tension type headache exacerbated by stress of her divorce and work. It has some characteristics like stabbing pain that we may find in cluster headaches, but there is not the temporal association or other characteristics. She was offered Ketoralac for treatment of a primary headache, but refused. She will try OTC analgesic and keep and headache diary. F/u in 1 month.

## 2011-09-27 ENCOUNTER — Telehealth: Payer: Self-pay | Admitting: Emergency Medicine

## 2011-09-27 DIAGNOSIS — R51 Headache: Secondary | ICD-10-CM

## 2011-09-27 NOTE — Telephone Encounter (Signed)
Placed referral to neurology order.

## 2011-09-27 NOTE — Telephone Encounter (Signed)
Asking for a referral to headache specialist. She stated that she has been seen several times her and thru the ED for this and would now like to have a second opinion.Natalie [name]

## 2011-10-01 ENCOUNTER — Encounter: Payer: Self-pay | Admitting: Emergency Medicine

## 2011-10-01 ENCOUNTER — Ambulatory Visit (INDEPENDENT_AMBULATORY_CARE_PROVIDER_SITE_OTHER): Payer: BC Managed Care – PPO | Admitting: Emergency Medicine

## 2011-10-01 VITALS — BP 122/74 | HR 71 | Ht 67.0 in | Wt 178.0 lb

## 2011-10-01 DIAGNOSIS — G44209 Tension-type headache, unspecified, not intractable: Secondary | ICD-10-CM

## 2011-10-01 NOTE — Telephone Encounter (Signed)
Pt called and informed.Natalie Day [place]  

## 2011-10-01 NOTE — Progress Notes (Signed)
  Subjective:    Patient ID: Natalie Day, female    DOB: October 28, 1980, 31 y.o.   MRN: [id number]  HPI Natalie Day is here for a SDA for headache.  Reports daily headache for last month or so located in forehead, occasionally throbbing, no nausea or photophobia, occur randomly, can be present on awakening.  Also has intermittent shooting pains in temples and occipital areas.  These last a few seconds and occur 2-3 times a day for the last month.  She has had these earlier this year, but had resolved.  Does report that she has a lot going on both at home and at work right now.  Denies having anyone to really talk to about all her concerns.  I have reviewed and updated the following as appropriate: allergies and current medications  Review of Systems See HPI    Objective:   Physical Exam BP 122/74  Pulse 71  Ht 5\' 7"  (1.702 m)  Wt 178 lb (80.74 kg)  BMI 27.88 kg/m2 Gen: alert, cooperative, did become tearful when discussing stressors HEENT: AT/[place], sclera white EOMI, PERRL, MMM, no pharyngeal erythema or exudate Neck: no LAD, supple CV: RRR, no murmurs Pulm: CTAB, no wheezes or rales Ext: no edema Neuro: CN II-XII intact bilaterally, strength 5/5 in all extremities, 2+ patellar reflexes bilaterally      Assessment & Plan:

## 2011-10-01 NOTE — Patient Instructions (Signed)
I'm sorry you are having headaches.  These are likely related to your stress level.  Please go to the website [email or web address]  You can do a search for providers that take your insurance and have experience in stress management.  Follow up with me if the headaches get worse, change, or you have further questions.

## 2011-10-01 NOTE — Assessment & Plan Note (Signed)
No red flags.  Discussed using tylenol and/or motrin for pain relief.  Discussed talking with counselor about stress in life; patient agreeable; handout of area therapist given.  Follow up in headaches worsen or change.

## 2011-12-11 ENCOUNTER — Encounter: Payer: Self-pay | Admitting: Family Medicine

## 2011-12-11 ENCOUNTER — Ambulatory Visit (INDEPENDENT_AMBULATORY_CARE_PROVIDER_SITE_OTHER): Payer: BC Managed Care – PPO | Admitting: Family Medicine

## 2011-12-11 VITALS — BP 136/71 | HR 82 | Temp 98.5°F | Ht 67.0 in | Wt 182.0 lb

## 2011-12-11 DIAGNOSIS — G5600 Carpal tunnel syndrome, unspecified upper limb: Secondary | ICD-10-CM

## 2011-12-11 MED ORDER — AMITRIPTYLINE HCL 25 MG PO TABS: 25.0000 mg | ORAL_TABLET | Freq: Every day | ORAL | Status: DC

## 2011-12-11 MED ORDER — IBUPROFEN 800 MG PO TABS: 800.0000 mg | ORAL_TABLET | Freq: Three times a day (TID) | ORAL | Status: DC | PRN

## 2011-12-11 NOTE — Patient Instructions (Signed)
I don't think that the knot in your hand is causing you pain.  I think it might be a component of carpal tunnel syndrome.   Wear the wrist splint at night to help relieve the pressure on the nerve.   Take the Ibuprofen regularly every 8 hours for pain and to reduce the inflammation.   Take the Amitriptyline 25 mg each night to help with the tingling.     Make an appointment to [facility] for an [facility] of your wrist.    Carpal Tunnel Syndrome The carpal tunnel is a narrow hollow area in the wrist. It is formed by the wrist bones and ligaments. Nerves, blood vessels, and tendons (cord like structures which attach muscle to bone) on the palm side (the side of your hand in the direction your fingers bend) of your hand pass through the carpal tunnel. Repeated wrist motion or certain diseases may cause swelling within the tunnel. (That is why these are called repetitive trauma (damage caused by over use) disorders. It is also a common problem in late pregnancy.) This swelling pinches the main nerve in the wrist (median nerve) and causes the painful condition called carpal tunnel syndrome. A feeling of "pins and needles" may be noticed in the fingers or hand; however, the entire arm may ache from this condition. Carpal tunnel syndrome may clear up by itself. Cortisone injections may help. Sometimes, an operation may be needed to free the pinched nerve. An electromyogram (a type of test) may be needed to confirm this diagnosis (learning what is wrong). This is a test which measures nerve conduction. The nerve conduction is usually slowed in a carpal tunnel syndrome. HOME CARE INSTRUCTIONS   If your caregiver prescribed medication to help reduce swelling, take as directed.   If you were given a splint to keep your wrist from bending, use it as instructed. It is important to wear the splint at night. Use the splint for as long as you have pain or numbness in your hand, arm or  wrist. This may take 1 to 2 months.   If you have pain at night, it may help to rub or shake your hand, or elevate your hand above the level of your heart (the center of your chest).   It is important to give your wrist a rest by stopping the activities that are causing the problem. If your symptoms (problems) are work-related, you may need to talk to your employer about changing to a job that does not require using your wrist.   Only take over-the-counter or prescription medicines for pain, discomfort, or fever as directed by your caregiver.   Following periods of extended use, particularly strenuous use, apply an ice pack wrapped in a towel to the anterior (palm) side of the affected wrist for 20 to 30 minutes. Repeat as needed three to four times per day. This will help reduce the swelling.   Follow all instructions for follow-up with your caregiver. This includes any orthopedic referrals, physical therapy, and rehabilitation. Any delay in obtaining necessary care could result in a delay or failure of your condition to heal.  SEEK IMMEDIATE MEDICAL CARE IF:   You are still having pain and numbness following a week of treatment.   You develop new, unexplained symptoms.   Your current symptoms are getting worse and are not helped or controlled with medications.  MAKE SURE YOU:   Understand these instructions.   Will watch your condition.  Will get help right away if you are not doing well or get worse.  Document Released: 03/01/2000 Document Revised: 02/21/2011 Document Reviewed: 01/18/2011 [facility] Patient Information 2012 [place], [place].

## 2011-12-11 NOTE — Progress Notes (Signed)
  Subjective:    Patient ID: Natalie Day, female    DOB: 09-23-1980, 31 y.o.   MRN: [id number]  HPI  1.  Left hand and forearm pain:  Patient states this started about 2 weeks ago, gradually increased in severity for several days, and then stopped.  Returned again yesterday evening.  Awoke her from sleep.  Took 3 tablets of 200 mg Ibuprofen without any relief.  Describes pain as alternating between "electrical currents" and numbness with tingling.  No change when driving.  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    Also, she found a "knot" in her hand and wonders if this is causing her pain.  No inciting injuries, no trauma.  No bruising at site.    Review of Systems See HPI above for review of systems.       Objective:   Physical Exam Gen:  Alert, cooperative patient who appears stated age in no acute distress.  Vital signs reviewed. Neck: No masses or thyromegaly or limitation in range of motion.  No cervical lymphadenopathy.  Spurling's negative MSK:  Strength and sensation 5/5 BL upper extremities, handgrip.  No paresthesias or numbness upon palpation.   Hand:  4 mm nodule located at base of 4th MCP joint.  Mildly TTP.         Assessment & Plan:

## 2011-12-11 NOTE — Assessment & Plan Note (Signed)
Plan to treat with Ibuprofen to relieve inflammation and pain.  Cock-up splint to be worn at night.  Elavil 25 mg for neuropathic pain relief, drowsy precautions provided. [facility] to ensure this is source of her pain and possible further treatment options at that time.

## 2011-12-16 ENCOUNTER — Ambulatory Visit (INDEPENDENT_AMBULATORY_CARE_PROVIDER_SITE_OTHER): Payer: BC Managed Care – PPO | Admitting: Family Medicine

## 2011-12-16 VITALS — BP 130/70 | Ht 67.0 in | Wt 180.0 lb

## 2011-12-16 DIAGNOSIS — M25539 Pain in unspecified wrist: Secondary | ICD-10-CM

## 2011-12-16 DIAGNOSIS — M6530 Trigger finger, unspecified finger: Secondary | ICD-10-CM

## 2011-12-16 DIAGNOSIS — M65342 Trigger finger, left ring finger: Secondary | ICD-10-CM

## 2011-12-16 NOTE — Progress Notes (Signed)
  Subjective:    Patient ID: Natalie Day, female    DOB: 27-May-1980, 31 y.o.   MRN: [id number]  HPI  2 weeks of progressive left wrist and hand pain. She noted a nodule in the palmar portion of her left hand about 2 weeks ago. Initially it was a bothering her much. Since then she started to have diffuse left hand and wrist pain it seems to be progressing upper to her elbow and even to her shoulder at times. Pain is diffuse. Not worse with any specific motion. She's had no hand weakness, no hand numbness. She types almost all day long Pertinent past medical history: Right-hand dominant. No history of left wrist or hand injury and no history of left hand surgery. Review of Systems Denies fever, sweats, chills. Has noted no skin rash or skin color change of her left hand.    Objective:   Physical Exam No acute distress WRISTS: Left. Full range of motion in extension and flexion. Negative Tinel. Negative Phalen. Supination is painless in her strength at the elbow is intact. Distally she is neurovascularly intact. HAND: Left. Fourth finger flexor tendon has a small nodule that is tender to palpation. Just over the A1 pulley. It moves when the ring finger moves. ULTRASOUND: Cystlike structure seen underneath the flexor tendon. The flexor tendon itself is intact and easily identified. PROCEDURE:INJECTION: Patient was given informed consent, signed copy in the chart. Appropriate time out was taken. Area prepped and draped in usual sterile fashion. 2 cc of    1% lidocaine without epinephrine was injected into the Area of the aPlml immediately above the nodule. We waited 2-3 minutes and then One half cc of 40 mg per cc methylprednisolone and 1/2 cc of 1% lidocaine was injected directly into the nodule using a perpendicular approach. . The patient tolerated the procedure well. There were no complications. Post procedure instructions were given.        Assessment & Plan:  #1. Ring finger trigger. We  discussed options such as conservative watch and wait, corticosteroid injection which she opted for today. Ultimately if this does not resolve her issues I refer her to hand surgery. I would wait 2-3 weeks to see if this is significantly improved. I don't think the splint is going to help her. #2. I did ultrasound her median nerve at the wrist on the left. It appears perfectly normal. I think her wrist and forearm pain is referred pain

## 2011-12-26 ENCOUNTER — Ambulatory Visit (INDEPENDENT_AMBULATORY_CARE_PROVIDER_SITE_OTHER): Payer: BC Managed Care – PPO | Admitting: Family Medicine

## 2011-12-26 ENCOUNTER — Encounter: Payer: Self-pay | Admitting: Family Medicine

## 2011-12-26 VITALS — BP 116/68 | Temp 99.0°F | Ht 67.0 in | Wt 180.7 lb

## 2011-12-26 DIAGNOSIS — R059 Cough, unspecified: Secondary | ICD-10-CM

## 2011-12-26 DIAGNOSIS — R05 Cough: Secondary | ICD-10-CM

## 2011-12-26 MED ORDER — GUAIFENESIN-CODEINE 200-20 MG/5ML PO LIQD: 5.0000 mL | Freq: Every day | ORAL | Status: DC

## 2011-12-26 MED ORDER — BENZONATATE 100 MG PO CAPS: 100.0000 mg | ORAL_CAPSULE | Freq: Three times a day (TID) | ORAL | Status: DC | PRN

## 2011-12-26 NOTE — Progress Notes (Signed)
Patient ID: Larene Ascencio, female   DOB: September 22, 1980, 31 y.o.   MRN: [id number] Subjective: The patient is a 31 y.o. year old female who presents today for cough.  Cough present for 3 days.  Worse at night.  Relatively non-productive.  No fevers/chills.  Other than cough, no symptoms.  No other sick contacts.  Non-smoker, not around smokers.  Up to date on immunizations.  No rhinorrhea.  Started with a bit of a sore throat.  Patient's past medical, social, and family history were reviewed and updated as appropriate. History  Substance Use Topics  . Smoking status: Never Smoker   . Smokeless tobacco: Never Used  . Alcohol Use: Not on file   Objective:  Filed Vitals:   12/26/11 1015  BP: 116/68  Temp: 99 F (37.2 C)   Gen: NAD HEENT: MMM, EOMI, TM normal bilaterally, no adenopathy, pharynx non-erythematous, no exudates CV: RRR Resp: CTABL  Assessment/Plan: Viral cough/pharyngitis: Symptomatic treatment only.  At this time symptoms have not been present long enough to be concerning for pertusis and there is no evidence of PNA or strep throat.  Please also see individual problems in problem list for problem-specific plans.

## 2011-12-26 NOTE — Patient Instructions (Signed)
It was good to see you today! I am giving you a prescription for Tessalon which is a pill you take up to 3 times daily for your cough. I am also giving you a cough syrup with codeine in it.  Only take it at night and be aware that if you have a random drug test in the week following taking it, you could end up testing positive for codeine.  If your job gives you any problems about this, have them contact us.

## 2012-03-15 ENCOUNTER — Encounter (HOSPITAL_COMMUNITY): Payer: Self-pay | Admitting: Family

## 2012-03-15 ENCOUNTER — Inpatient Hospital Stay (HOSPITAL_COMMUNITY)
Admission: AD | Admit: 2012-03-15 | Discharge: 2012-03-15 | Disposition: A | Discharge status: Home / Self Care | Payer: BC Managed Care – PPO | Source: Ambulatory Visit | Attending: Obstetrics and Gynecology | Admitting: Obstetrics and Gynecology

## 2012-03-15 DIAGNOSIS — E860 Dehydration: Secondary | ICD-10-CM | POA: Insufficient documentation

## 2012-03-15 DIAGNOSIS — M791 Myalgia, unspecified site: Secondary | ICD-10-CM

## 2012-03-15 DIAGNOSIS — R109 Unspecified abdominal pain: Secondary | ICD-10-CM

## 2012-03-15 HISTORY — DX: Papillomavirus as the cause of diseases classified elsewhere: B97.7

## 2012-03-15 HISTORY — DX: Chlamydial infection, unspecified: A74.9

## 2012-03-15 HISTORY — DX: Unspecified infectious disease: B99.9

## 2012-03-15 HISTORY — DX: Trichomoniasis, unspecified: A59.9

## 2012-03-15 LAB — URINALYSIS, ROUTINE W REFLEX MICROSCOPIC
Glucose, UA: NEGATIVE mg/dL
Ketones, ur: NEGATIVE mg/dL
Leukocytes, UA: NEGATIVE
pH: 6 (ref 5.0–8.0)

## 2012-03-15 LAB — URINE MICROSCOPIC-ADD ON

## 2012-03-15 LAB — WET PREP, GENITAL
Trich, Wet Prep: NONE SEEN
Yeast Wet Prep HPF POC: NONE SEEN

## 2012-03-15 NOTE — MAU Note (Addendum)
Pt c/o of abd and back pain tha is like a burning pain. Menstrual  like cramping but pt has IUD in and does not have periods. Reports she has a amonia smell coming from her vagina. Had odor a few months ago and Md gave her some medication (flagyl?) and it went away.

## 2012-03-15 NOTE — MAU Provider Note (Signed)
History     CSN: [id number]  Arrival date and time: 03/15/12 1138   First Provider Initiated Contact with Patient 03/15/12 1230      Chief Complaint  Patient presents with  . Abdominal Pain   HPI Ms. Natalie Day is a 31 y.o. G2 P0011 who presents to MAU today with complaint of lower abdominal cramping. The patient denies vaginal bleeding or abnormal discharge. The patient has an IUD for birth control and does not have periods with this. She has a history of STIs. She also states that she has a new sexual partner and although they use protection she is concerned that the pain could be from an infection. The patient also states that she just began a new exercise regimen in the last couple of days. The patient denies urinary symptoms and fever.   OB History    Grav Para Term Preterm Abortions TAB SAB Ect Mult Living   2    1     1       Past Medical History  Diagnosis Date  . Infection   . Trichimoniasis   . HPV (human papilloma virus) infection   . Chlamydia     Past Surgical History  Procedure Date  . Cesarean section     History reviewed. No pertinent family history.  History  Substance Use Topics  . Smoking status: Never Smoker   . Smokeless tobacco: Never Used  . Alcohol Use: No    Allergies: No Known Allergies  Prescriptions prior to admission  Medication Sig Dispense Refill  . ibuprofen (ADVIL,MOTRIN) 800 MG tablet Take 1 tablet (800 mg total) by mouth every 8 (eight) hours as needed for pain.  30 tablet  0  . NEXIUM 40 MG capsule TAKE 1 CAPSULE (40 MG TOTAL) BY MOUTH DAILY BEFORE BREAKFAST.  30 capsule  2    ROS All negative unless otherwise noted in HPI Physical Exam   Blood pressure 117/64, pulse 81, temperature 98.8 F (37.1 C), temperature source Oral, resp. rate 18, height 5\' 7"  (1.702 m), weight 178 lb 6.4 oz (80.922 kg).  Physical Exam  Constitutional: She is oriented to person, place, and time. She appears well-developed and well-nourished.  No distress.  HENT:  Head: Normocephalic.  Cardiovascular: Normal rate, regular rhythm and normal heart sounds.   Respiratory: Effort normal and breath sounds normal. No respiratory distress.  GI: Soft. Bowel sounds are normal. She exhibits no distension and no mass. There is tenderness (mild diffuse tenderness to palpation of the abdomen ). There is no rebound and no guarding.  Genitourinary: Vagina normal and uterus normal. Cervix exhibits discharge (very small amount of white, mucus-like discharge noted. Wet prep and GC/CT obtained). Cervix exhibits no motion tenderness and no friability. Right adnexum displays no mass and no tenderness. Left adnexum displays no mass and no tenderness. No vaginal discharge found.  Neurological: She is alert and oriented to person, place, and time.  Skin: Skin is warm and dry. No erythema.  Psychiatric: She has a normal mood and affect.   Results for orders placed during the hospital encounter of 03/15/12 (from the past 24 hour(s))  URINALYSIS, ROUTINE W REFLEX MICROSCOPIC     Status: Abnormal   Collection Time   03/15/12 11:45 AM      Component Value Range   Color, Urine YELLOW  YELLOW   APPearance CLEAR  CLEAR   Specific Gravity, Urine >1.030 (*) 1.005 - 1.030   pH 6.0  5.0 - 8.0  Glucose, UA NEGATIVE  NEGATIVE mg/dL   Hgb urine dipstick TRACE (*) NEGATIVE   Bilirubin Urine SMALL (*) NEGATIVE   Ketones, ur NEGATIVE  NEGATIVE mg/dL   Protein, ur NEGATIVE  NEGATIVE mg/dL   Urobilinogen, UA 0.2  0.0 - 1.0 mg/dL   Nitrite NEGATIVE  NEGATIVE   Leukocytes, UA NEGATIVE  NEGATIVE  URINE MICROSCOPIC-ADD ON     Status: Abnormal   Collection Time   03/15/12 11:45 AM      Component Value Range   Squamous Epithelial / LPF FEW (*) RARE   RBC / HPF 0-2  <3 RBC/hpf   Bacteria, UA FEW (*) RARE   Urine-Other MUCOUS PRESENT    POCT PREGNANCY, URINE     Status: Normal   Collection Time   03/15/12 11:58 AM      Component Value Range   Preg Test, Ur NEGATIVE   NEGATIVE  WET PREP, GENITAL     Status: Abnormal   Collection Time   03/15/12 12:32 PM      Component Value Range   Yeast Wet Prep HPF POC NONE SEEN  NONE SEEN   Trich, Wet Prep NONE SEEN  NONE SEEN   Clue Cells Wet Prep HPF POC NONE SEEN  NONE SEEN   WBC, Wet Prep HPF POC MANY (*) NONE SEEN    MAU Course  Procedures None  UA showed signs of dehydration, but no signs of UTI Wet prep did not show signs of infection GC/Chlamydia pending  Assessment and Plan  A: Mild dehydration Muscle aches  P: Discharge patient Recommend NSAIDs OTC PRN for pain Patient should follow-up with Dr. Dareen Piano as needed/scheduled  Freddi Starr, PA-C 03/15/2012, 1:40 PM

## 2012-03-15 NOTE — MAU Note (Signed)
Patient reports lower abdominal pain for last couple of weeks. New sexual partner for about same time. Has Mirena contraceptive for 4 years. Denies vaginal discharge/bleeding/or pain with intercourse.

## 2012-03-16 LAB — GC/CHLAMYDIA PROBE AMP: CT Probe RNA: NEGATIVE

## 2012-04-16 ENCOUNTER — Encounter: Payer: Self-pay | Admitting: Family Medicine

## 2012-04-16 ENCOUNTER — Other Ambulatory Visit (HOSPITAL_COMMUNITY)
Admission: RE | Admit: 2012-04-16 | Discharge: 2012-04-16 | Disposition: A | Discharge status: Home / Self Care | Payer: BC Managed Care – PPO | Source: Ambulatory Visit | Attending: Family Medicine | Admitting: Family Medicine

## 2012-04-16 ENCOUNTER — Ambulatory Visit (INDEPENDENT_AMBULATORY_CARE_PROVIDER_SITE_OTHER): Payer: BC Managed Care – PPO | Admitting: Family Medicine

## 2012-04-16 VITALS — BP 133/70 | HR 55 | Temp 98.9°F | Ht 67.0 in | Wt 176.5 lb

## 2012-04-16 DIAGNOSIS — Z113 Encounter for screening for infections with a predominantly sexual mode of transmission: Secondary | ICD-10-CM | POA: Insufficient documentation

## 2012-04-16 DIAGNOSIS — N760 Acute vaginitis: Secondary | ICD-10-CM

## 2012-04-16 LAB — POCT WET PREP (WET MOUNT): Clue Cells Wet Prep Whiff POC: NEGATIVE

## 2012-04-16 NOTE — Patient Instructions (Addendum)
It was nice to meet you today, Natalie Day. Please refer to handout below regarding common causes of vaginitis. It typically takes about 1-2 days for results to return. If today's lab results are ABNORMAL, we will call you and send medication to your pharmacy.   If you develop worsening symptoms, fever (temperature > 101.5 degrees), nausea/vomiting, or pelvic pain, please return to clinic.  What is vaginitis? Vaginitis is an inflammation of the vagina. Vulvovaginitis refers to inflammation of both the vagina and vulva (the external female genitals).   What are the most common types of vaginitis? The six most common types of vaginitis are:  Candida or "yeast" vaginitis  Bacterial vaginosis  Trichomoniasis vaginitis (a sexually transmitted infection)   What are candida or "yeast" infections? Yeast infections of the vagina are what most women think of when they hear the term "vaginitis." Yeast infections are caused by one of the many species of fungus called candida. Candida normally live in small numbers in the vagina, as well as in the mouth and digestive tract of both men and women.  Yeast infections produce a thick, white vaginal discharge with the consistency of cottage cheese. Although the discharge can be somewhat watery, it is odorless. Yeast infections usually cause the vagina and the vulva to be very itchy and red, even before the onset of discharge.  If yeast is normal in a woman's vagina, what makes it cause an infection? Usually, infection occurs when a change in the delicate balance in a woman's system takes place.   What is bacterial vaginosis? Although "yeast" is the name most women know, bacterial vaginosis (BV) actually is the most common vaginal infection in women of reproductive age. Bacterial vaginosis often will cause a vaginal discharge. The discharge usually is thin and milky, and is described as having a "fishy" odor. This odor may become more noticeable after intercourse.   Redness or itching of the vagina are not common symptoms of bacterial vaginosis. Some women with BV have no symptoms at all, and the vaginitis is only discovered during a routine gynecologic exam. Bacterial vaginosis is caused by a combination of several bacteria. These bacteria seem to overgrow in much the same way as do candida when the vaginal pH balance is upset.  Because bacterial vaginosis is caused by bacteria and not by yeast, medicine that is appropriate for yeast is not effective against the bacteria that cause bacterial vaginosis.  BV is treated in asymptomatic females of child bearing age to decrease their risk of preterm delivery.  Risk factors for BV include:  New or multiple sexual partners  Douching  Cigarette smoking  What is trichomoniasis? Trichomoniasis - Trichomoniasis is caused by a tiny single-celled organism known as a "protozoa." When this organism infects the vagina, it can cause a frothy, greenish-yellow discharge. Often this discharge will have a foul smell. Women with trichomonal vaginitis may complain of itching and soreness of the vagina and vulva, as well as burning during urination. In addition, there can be discomfort in the lower abdomen and vaginal pain with intercourse. These symptoms may be worse after the menstrual period. Many women, however, do not develop any symptoms. It is important to understand that this type of vaginitis can be transmitted through sexual intercourse. For treatment to be effective, the sexual partner must be treated at the same time as the patient.

## 2012-04-16 NOTE — Progress Notes (Signed)
  Subjective:    Patient ID: Natalie Day, female    DOB: 11-16-1980, 32 y.o.   MRN: [id number]  HPI  Patient presents to same day clinic for vaginal discharge.  Symptoms started about 4 days ago.  Discharge is described white, thick without odor.  Complains of vaginal itching labia.  Denies any dysuria or difficulty urinating.  Denies any vaginal bleeding.   Does complain of intermittent pelvic and low back pain that has been going on for about one month.  Denies any nausea or vomiting or fevers.  Normal appetite.  Has had yeast infection several years ago.  Sexual history: currently active with one partner.  Has a Mirena for birth control.  Review of Systems  Per HPI    Objective:   Physical Exam  Constitutional: She appears well-nourished. No distress.  Abdominal: Soft. Bowel sounds are normal. She exhibits no distension. There is no tenderness. There is no rebound and no guarding.  Genitourinary: Uterus normal. There is no rash, tenderness or lesion on the right labia. There is no rash, tenderness or lesion on the left labia. Cervix exhibits discharge. Cervix exhibits no motion tenderness and no friability. No erythema, tenderness or bleeding around the vagina. Vaginal discharge found.  Musculoskeletal:       No CVA tenderness      Assessment & Plan:

## 2012-04-16 NOTE — Addendum Note (Signed)
Addended by: Farrell Ours on: 04/16/2012 01:28 PM   Modules accepted: Orders

## 2012-04-16 NOTE — Assessment & Plan Note (Signed)
Copious amount of thick vaginal discharge on exam.  Will check wet prep, GC/chlamydia today and notify of results.  Patient not interested in HIV or RPR testing at his time.  Follow up as needed.

## 2012-07-20 ENCOUNTER — Other Ambulatory Visit: Payer: Self-pay | Admitting: *Deleted

## 2012-07-21 MED ORDER — ESOMEPRAZOLE MAGNESIUM 40 MG PO CPDR: DELAYED_RELEASE_CAPSULE | ORAL | Status: DC

## 2012-10-16 ENCOUNTER — Ambulatory Visit (HOSPITAL_COMMUNITY)
Admission: RE | Admit: 2012-10-16 | Discharge: 2012-10-16 | Disposition: A | Discharge status: Home / Self Care | Payer: BC Managed Care – PPO | Source: Ambulatory Visit | Attending: Emergency Medicine | Admitting: Emergency Medicine

## 2012-10-16 ENCOUNTER — Other Ambulatory Visit (HOSPITAL_COMMUNITY)
Admission: RE | Admit: 2012-10-16 | Discharge: 2012-10-16 | Disposition: A | Discharge status: Home / Self Care | Payer: BC Managed Care – PPO | Source: Ambulatory Visit | Attending: Family Medicine | Admitting: Family Medicine

## 2012-10-16 ENCOUNTER — Encounter: Payer: Self-pay | Admitting: Family Medicine

## 2012-10-16 ENCOUNTER — Ambulatory Visit (INDEPENDENT_AMBULATORY_CARE_PROVIDER_SITE_OTHER): Payer: BC Managed Care – PPO | Admitting: Family Medicine

## 2012-10-16 VITALS — BP 121/82 | HR 52 | Ht 67.0 in | Wt 168.0 lb

## 2012-10-16 DIAGNOSIS — I499 Cardiac arrhythmia, unspecified: Secondary | ICD-10-CM

## 2012-10-16 DIAGNOSIS — I498 Other specified cardiac arrhythmias: Secondary | ICD-10-CM | POA: Insufficient documentation

## 2012-10-16 DIAGNOSIS — Z113 Encounter for screening for infections with a predominantly sexual mode of transmission: Secondary | ICD-10-CM | POA: Insufficient documentation

## 2012-10-16 LAB — HIV ANTIBODY (ROUTINE TESTING W REFLEX): HIV: NONREACTIVE

## 2012-10-16 LAB — RPR

## 2012-10-16 LAB — POCT WET PREP (WET MOUNT)

## 2012-10-16 MED ORDER — ESOMEPRAZOLE MAGNESIUM 40 MG PO CPDR: DELAYED_RELEASE_CAPSULE | ORAL | Status: DC

## 2012-10-16 NOTE — Patient Instructions (Addendum)
It was nice to meet you today!  I will call you if your test results are not normal.  Otherwise, I will send you a letter.  If you do not hear from me with in 2 weeks please call our office.     Come back if the bleeding or cramping gets worse.  Schedule an appointment within the next month to see Dr. Elwyn Reach and discuss your acid reflux medicine.  Be well, Dr. Pollie Meyer

## 2012-10-16 NOTE — Progress Notes (Signed)
Patient ID: Saragrace Selke, female   DOB: 07-17-1980, 32 y.o.   MRN: [id number]    HPI:  STD testing: patient recently became sexually active after 5 months of abstinence. Has a new female partner. Previously had just one other female partner within the past year. Has had some spotting and cramping since becoming sexually active recently (within last week). Has Mirena IUD in place, and is due to get it removed in September because it will have been in for 5 years. Is thinking about having another Mirena put in at that time, because she has been pleased with it. The spotting and cramping did not occur prior to recently becoming sexually active again. Denies vaginal burning, pain, or discharge. Has hx of chlamydia about 10 years ago, which was treated. No other hx of STD's. She and her partner use condoms. 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   ROS: See HPI. Denies chest pain, palpitations, shortness of breath, fevers, night sweats, weight loss.  PMFSH: sexually active with one female partner currently.  PHYSICAL EXAM: BP 121/82  Pulse 52  Ht 5\' 7"  (1.702 m)  Wt 168 lb (76.204 kg)  BMI 26.31 kg/m2 Gen: NAD Heart: irregular rhythm, normal rate Lungs: CTAB Neuro: grossly nonfocal, speech intact GU: Normal appearing vaginal discharge present in vault. No lesions seen on vulva. IUD strings are visualized hanging from the cervical os. No tenderness on bimanual exam. No adnexal masses appreciable. No cervical motion tenderness.  ASSESSMENT/PLAN:  # STD screening: - will do wet prep, gc/chlamydia, HIV/RPR today - counseled patient on importance of partner also being tested, and using condoms to prevent STD transmission  # Refill - pt requested refill on Nexium. Agreed to refill one month's worth, but patient needs to schedule appt with her PCP (Dr. Elwyn Reach) to follow up on GERD as we did not address this issue today.  #  Cramping/spotting: likely secondary to recently resuming intercourse. No abnormalities on exam today. IUD in position (strings visualized). F/u if not worsening.

## 2012-10-17 DIAGNOSIS — I499 Cardiac arrhythmia, unspecified: Secondary | ICD-10-CM | POA: Insufficient documentation

## 2012-10-17 NOTE — Assessment & Plan Note (Signed)
Noted on exam today. Patient is completely asymptomatic. EKG done in clinic and shows marked sinus arrhythmia with sinus bradycardia. As pt asymptomatic, will not pursue additional workup at this time.

## 2012-10-21 ENCOUNTER — Encounter: Payer: Self-pay | Admitting: Family Medicine

## 2012-11-05 ENCOUNTER — Other Ambulatory Visit: Payer: Self-pay | Admitting: Obstetrics and Gynecology

## 2012-12-08 ENCOUNTER — Other Ambulatory Visit: Payer: Self-pay | Admitting: Emergency Medicine

## 2012-12-08 MED ORDER — ESOMEPRAZOLE MAGNESIUM 40 MG PO CPDR: DELAYED_RELEASE_CAPSULE | ORAL | Status: DC

## 2012-12-18 ENCOUNTER — Encounter: Payer: Self-pay | Admitting: Nurse Practitioner

## 2012-12-18 ENCOUNTER — Ambulatory Visit (INDEPENDENT_AMBULATORY_CARE_PROVIDER_SITE_OTHER): Payer: BC Managed Care – PPO | Admitting: Gynecology

## 2012-12-18 ENCOUNTER — Encounter: Payer: Self-pay | Admitting: Gynecology

## 2012-12-18 VITALS — BP 112/60 | HR 82 | Resp 20 | Ht 66.5 in | Wt 165.0 lb

## 2012-12-18 DIAGNOSIS — Z9889 Other specified postprocedural states: Secondary | ICD-10-CM

## 2012-12-18 DIAGNOSIS — Z0000 Encounter for general adult medical examination without abnormal findings: Secondary | ICD-10-CM

## 2012-12-18 DIAGNOSIS — Z202 Contact with and (suspected) exposure to infections with a predominantly sexual mode of transmission: Secondary | ICD-10-CM

## 2012-12-18 DIAGNOSIS — Z975 Presence of (intrauterine) contraceptive device: Secondary | ICD-10-CM | POA: Insufficient documentation

## 2012-12-18 DIAGNOSIS — J029 Acute pharyngitis, unspecified: Secondary | ICD-10-CM

## 2012-12-18 DIAGNOSIS — Z9189 Other specified personal risk factors, not elsewhere classified: Secondary | ICD-10-CM

## 2012-12-18 HISTORY — DX: Other specified postprocedural states: Z98.890

## 2012-12-18 LAB — POCT URINALYSIS DIPSTICK
Ketones, UA: NEGATIVE
Nitrite, UA: NEGATIVE
Urobilinogen, UA: NEGATIVE
pH, UA: 7

## 2012-12-18 MED ORDER — AMOXICILLIN-POT CLAVULANATE 875-125 MG PO TABS: 1.0000 | ORAL_TABLET | Freq: Two times a day (BID) | ORAL | Status: DC

## 2012-12-18 MED ORDER — FLUCONAZOLE 150 MG PO TABS: 150.0000 mg | ORAL_TABLET | Freq: Once | ORAL | Status: DC

## 2012-12-18 NOTE — Patient Instructions (Addendum)
  Place sexually transmitted diseases screening patient instructions here.  Thank you for enrolling in MyChart. Please follow the instructions below to securely access your online medical record. MyChart allows you to send messages to your doctor, view your test results, manage appointments, and more.   How Do I Sign Up? 1. In your Internet browser, go to [employer] and enter [email or web address]. 2. Click on the Sign Up Now link in the Sign In box. You will see the New Member Sign Up page. 3. Enter your MyChart Access Code exactly as it appears below. You will not need to use this code after you've completed the sign-up process. If you do not sign up before the expiration date, you must request a new code. MyChart Access Code: [email or web address] Expires: 02/16/2013 10:43 AM  4. Enter your Social Security Number ([id number]) and Date of Birth (mm/dd/yyyy) as indicated and click Submit. You will be taken to the next sign-up page. 5. Create a MyChart ID. This will be your MyChart login ID and cannot be changed, so think of one that is secure and easy to remember. 6. Create a MyChart password. You can change your password at any time. 7. Enter your Password Reset Question and Answer. This can be used at a later time if you forget your password.  8. Enter your e-mail address. You will receive e-mail notification when new information is available in MyChart. 9. Click Sign Up. You can now view your medical record.   Additional Information Remember, MyChart is NOT to be used for urgent needs. For medical emergencies, dial 911.

## 2012-12-18 NOTE — Progress Notes (Signed)
  Subjective:    Natalie Day is a 32 y.o. female who presents for sexually transmitted disease check. Sexual history reviewed with the patient. STI Exposure: new partner using condoms but reports had oral sex and now has a sore throat.  This occurred a week ago but the throat issues started the next day.. Previous history of STI chlamydia, trichomonas, 10y ago.. Current symptoms vaginal discharge: denies, pelvic pain: not present, fever, oral lesions. Contraception: IUD Menstrual History: OB History   Grav Para Term Preterm Abortions TAB SAB Ect Mult Living   2 1 1  1     1       Patient's last menstrual period was 03/18/2006.    The following portions of the patient's history were reviewed and updated as appropriate: allergies, current medications, past family history, past medical history, past social history, past surgical history and problem list.  Review of Systems Pertinent items are noted in HPI.    Objective:    BP 112/60  Pulse 82  Resp 20  Ht 5' 6.5" (1.689 m)  Wt 165 lb (74.844 kg)  BMI 26.24 kg/m2  LMP 03/18/2006 General:   alert, cooperative and appears stated age  Lymph Nodes:   Cervical adenopathy: negative and Inguinal adenopathy: negative  Pelvis:  External genitalia: normal general appearance Vaginal: normal without tenderness, induration or masses and thick dark mucoid blood Cervix: normal appearance, GC prep obtained, presence of blood from cervical os, IUD string visualized and no tenderness Adnexa: normal bimanual exam Uterus: normal single, nontender and anteverted  Cultures:  GC and Chlamydia genprobes and oral and genital, strept culture done    oral:  White exudate noted on right tonsillar area, min PND Assessment:    Possible STD exposure    Plan:    Discussed safe sexual practice in detail See orders for STD cultures and assays Will call pt with results  Will start treatment for possible strept throat Pt due for annual in January, wil  address other issues at that time, expect IUD due for removal?  Pt recalls placed 1y after son's birth, will get records from prior gyn and pathology regarding LEEP in 2001 to triage PAP needs- pt agrees.  Pt aware that IUD only good for 5y and may not be contraceptive at this time

## 2012-12-18 NOTE — Addendum Note (Signed)
Addended by: Douglass Rivers on: 12/18/2012 02:20 PM   Modules accepted: Orders

## 2012-12-19 LAB — RAPID STREP SCREEN (MED CTR MEBANE ONLY): Streptococcus, Group A Screen (Direct): NEGATIVE

## 2012-12-20 ENCOUNTER — Encounter: Payer: Self-pay | Admitting: Gynecology

## 2012-12-20 LAB — CULTURE, GROUP A STREP

## 2012-12-21 ENCOUNTER — Telehealth: Payer: Self-pay | Admitting: Gynecology

## 2012-12-21 NOTE — Telephone Encounter (Signed)
Patient is callin for results from testing she had done she said some are showing on mychart but she didn't know if the rest had came in.

## 2012-12-21 NOTE — Telephone Encounter (Signed)
Spoke with pt to advise the STD screens have not come back yet. Pt aware of neg strep cultures. Advised we will call her when other results are available. Pt agreeable.

## 2012-12-22 ENCOUNTER — Encounter: Payer: Self-pay | Admitting: Gynecology

## 2012-12-24 LAB — GC/CHLAMYDIA PROBE, AMP (THROAT): Chlamydia trachomatis RNA: NOT DETECTED

## 2013-01-21 ENCOUNTER — Other Ambulatory Visit: Payer: Self-pay

## 2013-10-13 ENCOUNTER — Other Ambulatory Visit: Payer: Self-pay | Admitting: Family Medicine

## 2013-11-01 ENCOUNTER — Other Ambulatory Visit: Payer: Self-pay | Admitting: *Deleted

## 2013-11-01 DIAGNOSIS — K219 Gastro-esophageal reflux disease without esophagitis: Secondary | ICD-10-CM

## 2013-11-01 MED ORDER — ESOMEPRAZOLE MAGNESIUM 40 MG PO CPDR: DELAYED_RELEASE_CAPSULE | ORAL | Status: DC

## 2013-12-08 ENCOUNTER — Encounter: Payer: Self-pay | Admitting: Family Medicine

## 2013-12-08 ENCOUNTER — Ambulatory Visit (INDEPENDENT_AMBULATORY_CARE_PROVIDER_SITE_OTHER): Payer: BC Managed Care – PPO | Admitting: Family Medicine

## 2013-12-08 VITALS — BP 105/71 | HR 67 | Temp 98.6°F | Ht 66.5 in | Wt 184.0 lb

## 2013-12-08 DIAGNOSIS — R05 Cough: Secondary | ICD-10-CM

## 2013-12-08 DIAGNOSIS — R059 Cough, unspecified: Secondary | ICD-10-CM | POA: Diagnosis not present

## 2013-12-08 MED ORDER — GUAIFENESIN-CODEINE 100-10 MG/5ML PO SOLN: 10.0000 mL | Freq: Three times a day (TID) | ORAL | Status: DC | PRN

## 2013-12-08 NOTE — Patient Instructions (Signed)
Nice to meet you. Please try the cough syrup for your cough. If your cough worsens, you develop chest pain or shortness of breath, or fever please seek medical attention.

## 2013-12-08 NOTE — Assessment & Plan Note (Signed)
Likely viral cough at this time, though could be related to her GERD. No abnormal lung sounds to indicate PNA or asthma. I discussed this with the patient and patient felt this was not related to her reflux at this time and chose to proceed with symptomatic treatment for now. Given Rx for guaifenesin-codeine. Given return precautions. If does not improve would consider changing treatment for GERD.

## 2013-12-08 NOTE — Progress Notes (Signed)
Patient ID: Eriyana Sweeten, female   DOB: 1980-10-18, 33 y.o.   MRN: [id number]  Marikay Alar, MD Phone: [phone]  Lana Flaim is a 33 y.o. female who presents today for same day appointment.  Cough: symptoms started 2 weeks ago. She had mild post nasal drip at that time. She notes mild productive cough since that time. She denies nasal congestion, ear fullness, and fever. She notes a burning sensation in the center of her chest that started recently and is associated with her cough. She denies radiation of the burning sensation, diaphoresis, reflux symptoms, sour taste in her mouth. She has tried OTC robitussin and cough drops with minimal benefit. She would like something to help her cough less. She notes she is on nexium for her reflux.  Patient is a nonsmoker.   ROS: Per HPI   Physical Exam Filed Vitals:   12/08/13 1143  BP: 105/71  Pulse: 67  Temp: 98.6 F (37 C)    Gen: Well NAD HEENT: PERRL,  MMM, no OP erythema, normal TMs bilaterally, no cervical LAD Lungs: CTABL Nl WOB Heart: RRR no MRG Abd: s, NT, ND Exts: Non edematous BL  LE, warm and well perfused.    Assessment/Plan: Please see individual problem list.   Marikay Alar, MD [facility] PGY-3

## 2014-01-17 ENCOUNTER — Encounter: Payer: Self-pay | Admitting: Family Medicine

## 2014-09-05 ENCOUNTER — Encounter: Payer: Self-pay | Admitting: Family Medicine

## 2014-09-05 ENCOUNTER — Ambulatory Visit: Payer: BC Managed Care – PPO | Admitting: Family Medicine

## 2014-09-05 ENCOUNTER — Ambulatory Visit (INDEPENDENT_AMBULATORY_CARE_PROVIDER_SITE_OTHER): Payer: BC Managed Care – PPO | Admitting: Family Medicine

## 2014-09-05 VITALS — BP 150/80 | Temp 98.5°F | Ht 66.5 in | Wt 191.2 lb

## 2014-09-05 DIAGNOSIS — R0602 Shortness of breath: Secondary | ICD-10-CM | POA: Diagnosis not present

## 2014-09-05 MED ORDER — OMEPRAZOLE 40 MG PO CPDR: 40.0000 mg | DELAYED_RELEASE_CAPSULE | Freq: Every day | ORAL | Status: DC

## 2014-09-05 NOTE — Progress Notes (Signed)
   Subjective:    Patient ID: Natalie Day, female    DOB: 27-Sep-1980, 34 y.o.   MRN: [id number]  HPI 34 year old female presents for same day appointment complaints of a sensation of something in her throat/difficulty breathing.  Patient reports that she was at work earlier this morning and developed a sensation that something was stuck in her throat (she felt as if she was choking) and had subsequent difficulty breathing.  A coworker called EMS and she was quickly evaluated. Her vital signs were stable and she was not taken to the emergency room.  Given her symptoms, she decided to come in this morning for evaluation. She reports that his happened 2 additional times since leaving her office this morning. No known inciting factors. No exacerbating factors. She reports that her symptoms resolved quickly within seconds.  She is currently symptomatic.  Of note, patient does have a long-standing history of reflux. Additionally, she has been under significant stress at home and at work. She has no past medical history of cardiac disease or lung disease.  Social Hx - Nonsmoker.   Review of Systems  Constitutional: Negative.   Respiratory: Positive for choking and shortness of breath.   Cardiovascular: Negative for chest pain and palpitations.      Objective:   Physical Exam Filed Vitals:   09/05/14 1132  BP: 150/80  Temp: 98.5 F (36.9 C)   Vital signs reviewed.  Exam: General: well appearing female in no acute distress. HEENT: NCAT. Oropharynx clear. Neck: Trachea midline. No masses noted. Cardiovascular: RRR. No murmurs, rubs, or gallops. Respiratory: CTAB. No rales, rhonchi, or wheeze.    Assessment & Plan:  See Problem List

## 2014-09-05 NOTE — Patient Instructions (Signed)
It was nice to see you today. Please take the medication as prescribed the next month. Follow-up closely with her primary next 2 weeks to one month. Please follow-up earlier if this continues to occur. Take care  Dr. Adriana Simas

## 2014-09-05 NOTE — Assessment & Plan Note (Signed)
With associated feeling as if something is stuck in her throat/choking. Differential diagnosis -GERD versus panic attack versus allergic reaction. I favor the diagnosis of GERD. Allergic reaction unlikely given that symptoms resolved within seconds. Patient is currently taking reflux medicine as needed. I sent a new prescription and informed her to take it daily for the next 30 days.   Patient follow-up closely with primary if this continues or fails to improve.

## 2014-11-03 ENCOUNTER — Other Ambulatory Visit: Payer: Self-pay | Admitting: *Deleted

## 2014-11-03 MED ORDER — OMEPRAZOLE 40 MG PO CPDR: 40.0000 mg | DELAYED_RELEASE_CAPSULE | Freq: Every day | ORAL | Status: DC

## 2015-04-25 ENCOUNTER — Encounter (HOSPITAL_COMMUNITY): Payer: Self-pay | Admitting: Emergency Medicine

## 2015-04-25 ENCOUNTER — Emergency Department (INDEPENDENT_AMBULATORY_CARE_PROVIDER_SITE_OTHER)
Admission: EM | Admit: 2015-04-25 | Discharge: 2015-04-25 | Disposition: A | Discharge status: Home / Self Care | Payer: Self-pay | Source: Home / Self Care | Attending: Family Medicine | Admitting: Family Medicine

## 2015-04-25 DIAGNOSIS — M545 Low back pain: Secondary | ICD-10-CM

## 2015-04-25 MED ORDER — NAPROXEN 500 MG PO TABS: 500.0000 mg | ORAL_TABLET | Freq: Two times a day (BID) | ORAL | Status: DC

## 2015-04-25 NOTE — ED Notes (Signed)
Pt d/c by frank patrick, pa 
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
## 2015-04-25 NOTE — Discharge Instructions (Signed)
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
## 2016-11-07 ENCOUNTER — Encounter: Payer: Self-pay | Admitting: Nurse Practitioner

## 2016-11-21 ENCOUNTER — Encounter: Payer: Self-pay | Admitting: Nurse Practitioner

## 2016-11-21 ENCOUNTER — Ambulatory Visit (INDEPENDENT_AMBULATORY_CARE_PROVIDER_SITE_OTHER): Payer: BC Managed Care – PPO | Admitting: Nurse Practitioner

## 2016-11-21 VITALS — BP 104/76 | Ht 67.0 in | Wt 205.4 lb

## 2016-11-21 DIAGNOSIS — F458 Other somatoform disorders: Secondary | ICD-10-CM | POA: Diagnosis not present

## 2016-11-21 DIAGNOSIS — K219 Gastro-esophageal reflux disease without esophagitis: Secondary | ICD-10-CM

## 2016-11-21 DIAGNOSIS — R0989 Other specified symptoms and signs involving the circulatory and respiratory systems: Secondary | ICD-10-CM

## 2016-11-21 DIAGNOSIS — R198 Other specified symptoms and signs involving the digestive system and abdomen: Secondary | ICD-10-CM

## 2016-11-21 MED ORDER — PANTOPRAZOLE SODIUM 40 MG PO TBEC: 40.0000 mg | DELAYED_RELEASE_TABLET | Freq: Two times a day (BID) | ORAL | 2 refills | Status: DC

## 2016-11-21 NOTE — Progress Notes (Signed)
Agree with assessment and plan as outlined.  Agree that EGD is indicated at this time and would recommend it be done now, although understand how she feels about it. If she is not getting better despite your recommendations she should contact us shortly for scheduling

## 2016-11-21 NOTE — Progress Notes (Signed)
HPI:  Patient is 36 year old female, new to the practice and here for evaluation of GERD. She has a very long-standing history of GERD. Heartburn was main symptom until about two years ago when she developed globus sensation.  She was at work, felt like something was in her throat and had difficulty breathing. [facility]Ms was called, not taken to ED. She subsequently saw [facility] but no procedures were done. She did have an EGD by [facility] approximately 15 years ago however. She has frequent breakthrough pyrosis and globus getting worse despite BID PPI which she has been taking for 2 years.  She does not go to bed on an empty stomach and is sometimes awoken by heartburn. Head of bed is not elevated.   She has been having occasional solid food dysphagia as well. No associated weight loss. No other GI complaints such as bowel changes or blood in stool.   Past Medical History:  Diagnosis Date  . Abnormal Pap smear 2001   Dysplasia, Colpo/BX, LEEP  . Amenorrhea   . Chlamydia   . HPV (human papilloma virus) infection   . Infection   . STD (sexually transmitted disease)   . Trichimoniasis      Past Surgical History:  Procedure Laterality Date  . CERVICAL BIOPSY  W/ LOOP ELECTRODE EXCISION  2001  . CESAREAN SECTION    . COLPOSCOPY     Family History  Problem Relation Age of Onset  . Colon cancer Neg Hx   . Pancreatic cancer Neg Hx   . Esophageal cancer Neg Hx    Social History  Substance Use Topics  . Smoking status: Never Smoker  . Smokeless tobacco: Never Used  . Alcohol use No   Current Outpatient Prescriptions  Medication Sig Dispense Refill  . levonorgestrel (MIRENA) 20 MCG/24HR IUD 1 each by Intrauterine route once.    . pantoprazole (PROTONIX) 40 MG tablet Take 1 tablet (40 mg total) by mouth 2 (two) times daily before a meal. 60 tablet 2   No current facility-administered medications for this visit.    No Known Allergies   Review of Systems: All systems reviewed and  negative except where noted in HPI.    Physical Exam: BP 104/76   Ht 5\' 7"  (1.702 m)   Wt 205 lb 6 oz (93.2 kg)   BMI 32.17 kg/m  Constitutional:  Well-developed, female in no acute distress. Psychiatric: Normal mood and affect. Behavior is normal. EENT: Pupils normal.  Conjunctivae are normal. No scleral icterus. Neck supple.  Cardiovascular: Normal rate, regular rhythm. No edema Pulmonary/chest: Effort normal and breath sounds normal. No wheezing, rales or rhonchi. Abdominal: Soft, nondistended. Nontender. Bowel sounds active throughout. There are no masses palpable. No hepatomegaly. Lymphadenopathy: No cervical adenopathy noted. Neurological: Alert and oriented to person place and time. Skin: Skin is warm and dry. No rashes noted.  ASSESSMENT AND PLAN:  1. Pleasant 36 year old female long-standing GERD. She has frequent breakthrough pyrosis and globus sensation on BID PPI. Admits to intermittent solid food dysphagia as well.  -Discussed antireflux measures. There is definitely room for improvement with weight loss, going to bed on an empty stomach, propping self up at night. I recommended a wedge pillow. GERD literature given -Continue twice a day PPI 30 minutes before meals -We discussed EGD for evaluation of refractory GERD symptoms and dysphagia. Patient would like to try antireflux measures over the next few weeks and proceed with EGD if not improving. I think this is  a reasonable plan. She will call us in a few weeks with a progress report. If dysphagia does not improved and patient hesitant about proceeding with EGD then we can entertain a barium swallow with tablet to rule out stricture -She asked about side effects associated with long-term use of PPI, we discussed some of these as well as the risk-benefit ratio of treatment  2. Hx of gestational associated thrombocytopenia.   Willette Cluster, NP  11/21/2016, 9:03 AM

## 2016-11-21 NOTE — Patient Instructions (Signed)
We have sent the following medications to your pharmacy for you to pick up at your convenience: pantoprazole twice daily before meals.   You have been GERD literature and you can use a wedge pillow at night.  Call our office in 4 weeks with a symptom update.

## 2016-11-27 ENCOUNTER — Telehealth: Payer: Self-pay

## 2016-11-27 NOTE — Telephone Encounter (Signed)
-----   Message from Meredith PelPaula M Guenther, NP sent at 11/27/2016 11:43 AM EDT ----- Waynetta SandyBeth, can you call this patient late next week and get an update on her dysphagia? I told her she would need further evaluation if not improving.  Thanks

## 2016-11-27 NOTE — Telephone Encounter (Signed)
Noted. Will hold this note for 1 week and then reach out to the patient.

## 2016-12-05 NOTE — Telephone Encounter (Signed)
Called patient to ask her how she is doing. She reports some improvement and states she is "feeling a whole lot better." Specifically she is not having reflux, pain or waking up in the night "with that pain." She has spells in the day where she has to catch her breath when she is talking. Denies cough, dysphagia or choking. She would like to "give it a little more time" before further evaluation.

## 2016-12-10 NOTE — Telephone Encounter (Signed)
That sounds fine, glad she is better. Thanks

## 2017-02-11 ENCOUNTER — Encounter: Payer: Self-pay | Admitting: Gastroenterology

## 2017-02-12 ENCOUNTER — Telehealth: Payer: Self-pay

## 2017-02-12 ENCOUNTER — Other Ambulatory Visit: Payer: Self-pay

## 2017-02-12 MED ORDER — PANTOPRAZOLE SODIUM 40 MG PO TBEC: 40.0000 mg | DELAYED_RELEASE_TABLET | Freq: Two times a day (BID) | ORAL | 2 refills | Status: DC

## 2017-02-12 NOTE — Telephone Encounter (Signed)
Pharmacy requesting a 90-day supply for Protonix.  New prescription sent electronically.

## 2017-03-26 ENCOUNTER — Ambulatory Visit (AMBULATORY_SURGERY_CENTER): Payer: Self-pay | Admitting: *Deleted

## 2017-03-26 ENCOUNTER — Other Ambulatory Visit: Payer: Self-pay

## 2017-03-26 VITALS — Ht 67.0 in | Wt 201.0 lb

## 2017-03-26 DIAGNOSIS — K219 Gastro-esophageal reflux disease without esophagitis: Secondary | ICD-10-CM

## 2017-03-26 NOTE — Progress Notes (Signed)
Patient denies any allergies to eggs or soy. Patient denies any problems with anesthesia/sedation. Patient denies any oxygen use at home. Patient denies taking any diet/weight loss medications or blood thinners. EMMI education assisgned to patient on EGD, this was explained and instructions given to patient. Patient c/o acid reflux that make it hard for her to catch her breath for a few seconds, encouraged patient to let Dr.Armbruster know before her EGD.

## 2017-03-28 ENCOUNTER — Encounter: Payer: Self-pay | Admitting: Gastroenterology

## 2017-04-02 ENCOUNTER — Telehealth: Payer: Self-pay | Admitting: Gastroenterology

## 2017-04-02 NOTE — Telephone Encounter (Signed)
Returned patient's call and she has a cough, headache and scratchy throat.  She wants to know if she can take something OTC for this.  I asked her if she was running a fever and she denied.  I advised her that it would be fine as long as it is nothing red or purple in color and that she doesn't take anything after 3 hour cut off time prior to procedure.  She understood and all questions/concerns were addressed.  B.Benitez, CMA

## 2017-04-03 ENCOUNTER — Ambulatory Visit (AMBULATORY_SURGERY_CENTER): Payer: BC Managed Care – PPO | Admitting: Gastroenterology

## 2017-04-03 ENCOUNTER — Encounter: Payer: Self-pay | Admitting: Gastroenterology

## 2017-04-03 ENCOUNTER — Other Ambulatory Visit: Payer: Self-pay

## 2017-04-03 VITALS — BP 119/79 | HR 74 | Temp 98.6°F | Resp 12 | Ht 67.0 in | Wt 201.0 lb

## 2017-04-03 DIAGNOSIS — K219 Gastro-esophageal reflux disease without esophagitis: Secondary | ICD-10-CM | POA: Diagnosis present

## 2017-04-03 DIAGNOSIS — K317 Polyp of stomach and duodenum: Secondary | ICD-10-CM | POA: Diagnosis not present

## 2017-04-03 MED ORDER — SODIUM CHLORIDE 0.9 % IV SOLN: 500.0000 mL | INTRAVENOUS | Status: DC

## 2017-04-03 NOTE — Op Note (Signed)
[facility] Patient Name: Natalie Day Procedure Date: 04/03/2017 8:32 AM MRN: [id number] Endoscopist: Viviann Spare P. Wrenly Lauritsen MD, MD Age: 37 Referring MD:  Date of Birth: [birth year] Gender: Female Account #: [id number] Procedure:                Upper GI endoscopy Indications:              Dysphagia, Heartburn - symptoms mostly controlled                            if protonix 40mg  is taken twice daily Medicines:                Monitored Anesthesia Care Procedure:                Pre-Anesthesia Assessment:                           - Prior to the procedure, a History and Physical                            was performed, and patient medications and                            allergies were reviewed. The patient's tolerance of                            previous anesthesia was also reviewed. The risks                            and benefits of the procedure and the sedation                            options and risks were discussed with the patient.                            All questions were answered, and informed consent                            was obtained. Prior Anticoagulants: The patient has                            taken no previous anticoagulant or antiplatelet                            agents. ASA Grade Assessment: II - A patient with                            mild systemic disease. After reviewing the risks                            and benefits, the patient was deemed in                            satisfactory condition to undergo the procedure.  After obtaining informed consent, the endoscope was                            passed under direct vision. Throughout the                            procedure, the patient's blood pressure, pulse, and                            oxygen saturations were monitored continuously. The                            Endoscope was introduced through the mouth, and                            advanced  to the second part of duodenum. The upper                            GI endoscopy was accomplished without difficulty.                            The patient tolerated the procedure well. Scope In: Scope Out: Findings:                 Esophagogastric landmarks were identified: the                            Z-line was found at 37 cm, the gastroesophageal                            junction was found at 37 cm and the upper extent of                            the gastric folds was found at 38 cm from the                            incisors.                           A 1 cm hiatal hernia was present.                           The exam of the esophagus was otherwise normal. No                            obvious inflammatory changes. No stenosis or                            stricture appreciated.                           Biopsies were taken with a cold forceps in the  upper third of the esophagus, in the middle third                            of the esophagus and in the lower third of the                            esophagus for histology to rule out eosinophilic                            esophagitis.                           A few benign appearing small gastric polyps were                            noted in the gastric bosy. The largest was a single                            4 to 5 mm semi-sessile polyp. The polyp was removed                            with a cold biopsy forceps. Resection and retrieval                            were complete.                           The exam of the stomach was otherwise normal.                           The duodenal bulb and second portion of the                            duodenum were normal. Complications:            No immediate complications. Estimated blood loss:                            Minimal. Estimated Blood Loss:     Estimated blood loss was minimal. Impression:               - Esophagogastric landmarks  identified.                           - 1 cm hiatal hernia.                           - Normal remainder of examined esophagus - biopsies                            taken to rule out eosinophilic esophagitis.                           - A few benign appearing, suspect fundic gland  polyps, largest was resected and retrieved.                           - Normal stomach otherwise.                           - Normal duodenal bulb and second portion of the                            duodenum. Recommendation:           - Patient has a contact number available for                            emergencies. The signs and symptoms of potential                            delayed complications were discussed with the                            patient. Return to normal activities tomorrow.                            Written discharge instructions were provided to the                            patient.                           - Resume previous diet.                           - Continue present medications - recommend protonix                            twice daily if that is the dose needed to control                            symptoms.                           - Await pathology results with further                            recommendations Viviann SpareSteven P. Jaysion Ramseyer MD, MD 04/03/2017 8:49:34 AM This report has been signed electronically.

## 2017-04-03 NOTE — Progress Notes (Signed)
Report given to PACU, vss 

## 2017-04-03 NOTE — Progress Notes (Signed)
Called to room to assist during endoscopic procedure.  Patient ID and intended procedure confirmed with present staff. Received instructions for my participation in the procedure from the performing physician.  

## 2017-04-03 NOTE — Patient Instructions (Signed)
YOU HAD AN ENDOSCOPIC PROCEDURE TODAY AT THE [facility]:   Refer to the procedure report that was given to you for any specific questions about what was found during the examination.  If the procedure report does not answer your questions, please call your gastroenterologist to clarify.  If you requested that your care partner not be given the details of your procedure findings, then the procedure report has been included in a sealed envelope for you to review at your convenience later.  YOU SHOULD EXPECT: Some feelings of bloating in the abdomen. Passage of more gas than usual.  Walking can help get rid of the air that was put into your GI tract during the procedure and reduce the bloating.   Please Note:  You might notice some irritation and congestion in your nose or some drainage.  This is from the oxygen used during your procedure.  There is no need for concern and it should clear up in a day or so.  SYMPTOMS TO REPORT IMMEDIATELY:    Following upper endoscopy (EGD)  Vomiting of blood or coffee ground material  New chest pain or pain under the shoulder blades  Painful or persistently difficult swallowing  New shortness of breath  Fever of 100F or higher  Black, tarry-looking stools  For urgent or emergent issues, a gastroenterologist can be reached at any hour by calling [phone].   DIET:  We do recommend a small meal at first, but then you may proceed to your regular diet.  Drink plenty of fluids but you should avoid alcoholic beverages for 24 hours.  ACTIVITY:  You should plan to take it easy for the rest of today and you should NOT DRIVE or use heavy machinery until tomorrow (because of the sedation medicines used during the test).    FOLLOW UP: Our staff will call the number listed on your records the next business day following your procedure to check on you and address any questions or concerns that you may have regarding the information given to you  following your procedure. If we do not reach you, we will leave a message.  However, if you are feeling well and you are not experiencing any problems, there is no need to return our call.  We will assume that you have returned to your regular daily activities without incident.  If any biopsies were taken you will be contacted by phone or by letter within the next 1-3 weeks.  Please call us at [phone] if you have not heard about the biopsies in 3 weeks.    SIGNATURES/CONFIDENTIALITY: You and/or your care partner have signed paperwork which will be entered into your electronic medical record.  These signatures attest to the fact that that the information above on your After Visit Summary has been reviewed and is understood.  Full responsibility of the confidentiality of this discharge information lies with you and/or your care-partner.  Read all instructions given to you by your recovery room nurse.

## 2017-04-04 ENCOUNTER — Telehealth: Payer: Self-pay | Admitting: *Deleted

## 2017-04-04 NOTE — Telephone Encounter (Signed)
  Follow up Call-  Call back number 04/03/2017  Post procedure Call Back phone  # [phone]  Permission to leave phone message Yes  Some recent data might be hidden     Patient questions:  Do you have a fever, pain , or abdominal swelling? No. Pain Score  0 *  Have you tolerated food without any problems? Yes.    Have you been able to return to your normal activities? Yes.    Do you have any questions about your discharge instructions: Diet   No. Medications  No. Follow up visit  No.  Do you have questions or concerns about your Care? No.  Actions: * If pain score is 4 or above: No action needed, pain <4.

## 2017-04-09 ENCOUNTER — Encounter: Payer: Self-pay | Admitting: Gastroenterology

## 2017-08-18 ENCOUNTER — Ambulatory Visit: Payer: BC Managed Care – PPO | Admitting: Family Medicine

## 2017-08-18 ENCOUNTER — Encounter: Payer: Self-pay | Admitting: Family Medicine

## 2017-08-18 VITALS — BP 102/90 | HR 63 | Ht 67.0 in | Wt 199.0 lb

## 2017-08-18 DIAGNOSIS — R519 Headache, unspecified: Secondary | ICD-10-CM

## 2017-08-18 DIAGNOSIS — R51 Headache: Secondary | ICD-10-CM

## 2017-08-18 NOTE — Assessment & Plan Note (Addendum)
Likely tension type headaches given elevated stress and relief from NSAID. Discussed stress mgmt techniques.  May continue NSAID, recommend avoiding fiorinal for now.  Differential includes trigeminal neuralgia and cluster headaches given description of electric shock/stabbing sensation, will refer to neurology as well given fairly acute onset and description of headaches.

## 2017-08-18 NOTE — Patient Instructions (Signed)
I would avoid fiorinal for now You may use ibuprofen or aleve if needed for headache Stay well hydrated Work on stress management techniques.    Stress management is the recommended treatment for stress. The goals of stress management are reducing stressful life events and coping with stress in healthy ways. Techniques for reducing stressful life events include the following:  Stress identification. Self-monitor for stress and identify what causes stress for you. These skills may help you to avoid some stressful events.  Time management. Set your priorities, keep a calendar of events, and learn to say "no." These tools can help you avoid making too many commitments.  Techniques for coping with stress include the following:  Rethinking the problem. Try to think realistically about stressful events rather than ignoring them or overreacting. Try to find the positives in a stressful situation rather than focusing on the negatives.  Exercise. Physical exercise can release both physical and emotional tension. The key is to find a form of exercise you enjoy and do it regularly.  Relaxation techniques. These relax the body and mind. Examples include yoga, meditation, tai chi, biofeedback, deep breathing, progressive muscle relaxation, listening to music, being out in nature, journaling, and other hobbies. Again, the key is to find one or more that you enjoy and can do regularly.  Healthy lifestyle. Eat a balanced diet, get plenty of sleep, and do not smoke. Avoid using alcohol or drugs to relax.  Strong support network. Spend time with family, friends, or other people you enjoy being around. Express your feelings and talk things over with someone you trust.  Counseling or talktherapy with a mental health professional may be helpful if you are having difficulty managing stress on your own. Medicine is typically not recommended for the treatment of stress. Talk to your health care provider if you  think you need medicine for symptoms of stress.

## 2017-08-18 NOTE — Progress Notes (Signed)
Natalie ReiningRokesha Emigh - 37 y.o. female MRN [id number]  Date of birth: September 22, 1980  Subjective Chief Complaint  Patient presents with  . Migraine    HPI Natalie Day is a 37 y.o. female here today to establish care with new pcp and complaint of headache.   Reports that headaches began ~1 month ago, describes as shock/stabbing sensation along scalp and temples as well as dull headaches at time.  Pain is not limited to one side, she denies neck pain, associated nausea or vomiting, light or sound sensitivity or vision changes.  She was seen by her previous pcp and prescribed fiorinal which did not help a whole lot.  She has used 800mg  ibuprofen which has been helpful.  She does report being under a large amount of stress due to school and work activities and doesn't sleep well.   ROS: ROS completed and negative except as noted per HPI   No Known Allergies  Past Medical History:  Diagnosis Date  . Abnormal Pap smear 2001   Dysplasia, Colpo/BX, LEEP  . Amenorrhea   . Chlamydia   . HPV (human papilloma virus) infection   . Infection   . STD (sexually transmitted disease)   . Trichimoniasis     Past Surgical History:  Procedure Laterality Date  . CERVICAL BIOPSY  W/ LOOP ELECTRODE EXCISION  2001  . CESAREAN SECTION    . COLPOSCOPY    . UPPER GASTROINTESTINAL ENDOSCOPY  15 years ago     Social History   Socioeconomic History  . Marital status: Single    Spouse name: Not on file  . Number of children: 1  . Years of education: Not on file  . Highest education level: Not on file  Occupational History  . Not on file  Social Needs  . Financial resource strain: Not on file  . Food insecurity:    Worry: Not on file    Inability: Not on file  . Transportation needs:    Medical: Not on file    Non-medical: Not on file  Tobacco Use  . Smoking status: Never Smoker  . Smokeless tobacco: Never Used  Substance and Sexual Activity  . Alcohol use: No  . Drug use: No  . Sexual activity: Yes     Partners: Male    Birth control/protection: IUD    Comment: Mirena  Lifestyle  . Physical activity:    Days per week: Not on file    Minutes per session: Not on file  . Stress: Not on file  Relationships  . Social connections:    Talks on phone: Not on file    Gets together: Not on file    Attends religious service: Not on file    Active member of club or organization: Not on file    Attends meetings of clubs or organizations: Not on file    Relationship status: Not on file  Other Topics Concern  . Not on file  Social History Narrative  . Not on file    Family History  Problem Relation Age of Onset  . Colon cancer Neg Hx   . Pancreatic cancer Neg Hx   . Esophageal cancer Neg Hx     Health Maintenance  Topic Date Due  . TETANUS/TDAP  02/16/2015  . INFLUENZA VACCINE  10/16/2017  . PAP SMEAR  03/18/2020  . HIV Screening  Completed    ----------------------------------------------------------------------------------------------------------------------------------------------------------------------------------------------------------------- Physical Exam BP 102/90   Pulse 63   Ht 5\' 7"  (1.702 m)   Wt  199 lb (90.3 kg)   BMI 31.17 kg/m   Physical Exam  Constitutional: She is oriented to person, place, and time. She appears well-nourished. No distress.  HENT:  Head: Normocephalic and atraumatic.  Mouth/Throat: Oropharynx is clear and moist.  Eyes: No scleral icterus.  Neck: Neck supple. No thyromegaly present.  Cardiovascular: Normal rate, regular rhythm and normal heart sounds.  Pulmonary/Chest: Effort normal and breath sounds normal.  Musculoskeletal: Normal range of motion.  Lymphadenopathy:    She has no cervical adenopathy.  Neurological: She is alert and oriented to person, place, and time. No cranial nerve deficit. Coordination normal.  Skin: Skin is warm and dry.  Psychiatric: She has a normal mood and affect. Her behavior is normal.     ------------------------------------------------------------------------------------------------------------------------------------------------------------------------------------------------------------------- Assessment and Plan  New onset of headaches Likely tension type headaches given elevated stress and relief from NSAID. Discussed stress mgmt techniques.  May continue NSAID, recommend avoiding fiorinal for now.  Differential includes trigeminal neuralgia and cluster headaches given description of electric shock/stabbing sensation, will refer to neurology as well given fairly acute onset and description of headaches.

## 2017-10-23 ENCOUNTER — Encounter: Payer: Self-pay | Admitting: Neurology

## 2017-10-23 ENCOUNTER — Encounter

## 2017-10-23 ENCOUNTER — Telehealth: Payer: Self-pay | Admitting: Neurology

## 2017-10-23 ENCOUNTER — Ambulatory Visit: Payer: BC Managed Care – PPO | Admitting: Neurology

## 2017-10-23 VITALS — BP 98/70 | HR 77 | Ht 67.0 in | Wt 198.5 lb

## 2017-10-23 DIAGNOSIS — G4489 Other headache syndrome: Secondary | ICD-10-CM | POA: Diagnosis not present

## 2017-10-23 NOTE — Patient Instructions (Signed)
Reduce the caffeine intake during the day, call if the headaches are not better within the next 6 weeks.

## 2017-10-23 NOTE — Telephone Encounter (Signed)
Pt. States she will call us back to set up 3 mo f/u / np per Dr. Anne HahnWillis

## 2017-10-23 NOTE — Progress Notes (Signed)
Reason for visit: Headache  Referring physician: Dr. Marta LamasMatthews  Natalie Day is a 37 y.o. female  History of present illness:  Natalie Day is a 37 year old right-handed black female with a history of headaches that have started about 3 months ago.  The patient reports she had similar headaches about a year ago that lasted several weeks and then went away, she did not seek medical attention.  The patient indicates that her headaches generally are present upon awakening in the morning, she initially had sharp shooting pains in the temporal regions bilaterally lasted about 3 weeks and then converted more to a dull achy pain that would last several hours in the morning.  The patient occasionally may have some occipital discomfort.  She denies any significant neck pain.  She reports no throbbing sensations, she has no nausea or vomiting, she reports no photophobia or phonophobia.  The patient has no family history of migraine headache.  The patient indicates that she drinks 1 cup of coffee daily, she will have 2 or 3 caffeinated soft drinks daily and she drinks iced tea.  The patient indicates that she oftentimes does not sleep well, it is not clear that she snores at night.  She indicates that stress may worsen her headaches.  She reports no numbness or weakness of the face, arms, legs.  She denies any significant neck pain, she denies issues controlling the bowels or the bladder.  She is sent to this office for an evaluation.  Previously, she was given Fioricet for the headaches, this was not helpful, Motrin does help the headaches.  The patient does not miss work or any daily activities because of the headache.  Past Medical History:  Diagnosis Date  . Abnormal Pap smear 2001   Dysplasia, Colpo/BX, LEEP  . Amenorrhea   . Chlamydia   . HPV (human papilloma virus) infection   . Infection   . STD (sexually transmitted disease)   . Trichimoniasis     Past Surgical History:  Procedure  Laterality Date  . CERVICAL BIOPSY  W/ LOOP ELECTRODE EXCISION  2001  . CESAREAN SECTION    . COLPOSCOPY    . UPPER GASTROINTESTINAL ENDOSCOPY  15 years ago     Family History  Problem Relation Age of Onset  . Colon cancer Neg Hx   . Pancreatic cancer Neg Hx   . Esophageal cancer Neg Hx     Social history:  reports that she has never smoked. She has never used smokeless tobacco. She reports that she does not drink alcohol or use drugs.  Medications:  Prior to Admission medications   Medication Sig Start Date End Date Taking? Authorizing Provider  levonorgestrel (MIRENA) 20 MCG/24HR IUD 1 each by Intrauterine route once.   Yes [provider]  Multiple Vitamin (MULTIVITAMIN) tablet Take 1 tablet by mouth daily.   Yes [provider]  pantoprazole (PROTONIX) 40 MG tablet Take 1 tablet (40 mg total) by mouth 2 (two) times daily before a meal. 02/12/17  Yes Meredith PelGuenther, Paula M, NP     No Known Allergies  ROS:  Out of a complete 14 system review of symptoms, the patient complains only of the following symptoms, and all other reviewed systems are negative.  Swelling in the legs Feeling hot, flushing Headache  Blood pressure 98/70, pulse 77, height 5\' 7"  (1.702 m), weight 198 lb 8 oz (90 kg), SpO2 98 %.  Physical Exam  General: The patient is alert and cooperative at the  time of the examination.  The patient is mildly obese.  Eyes: Pupils are equal, round, and reactive to light. Discs are flat bilaterally.  Venous pulsations are present.  Neck: The neck is supple, no carotid bruits are noted.  Respiratory: The respiratory examination is clear.  Cardiovascular: The cardiovascular examination reveals a regular rate and rhythm, no obvious murmurs or rubs are noted.  Neuromuscular: Range of movement the cervical spine is full.  No crepitus is noted in the temporal mandibular joints.  Skin: Extremities are without significant edema.  Neurologic Exam  Mental  status: The patient is alert and oriented x 3 at the time of the examination. The patient has apparent normal recent and remote memory, with an apparently normal attention span and concentration ability.  Cranial nerves: Facial symmetry is present. There is good sensation of the face to pinprick and soft touch bilaterally. The strength of the facial muscles and the muscles to head turning and shoulder shrug are normal bilaterally. Speech is well enunciated, no aphasia or dysarthria is noted. Extraocular movements are full. Visual fields are full. The tongue is midline, and the patient has symmetric elevation of the soft palate. No obvious hearing deficits are noted.  Motor: The motor testing reveals 5 over 5 strength of all 4 extremities. Good symmetric motor tone is noted throughout.  Sensory: Sensory testing is intact to pinprick, soft touch, vibration sensation, and position sense on all 4 extremities. No evidence of extinction is noted.  Coordination: Cerebellar testing reveals good finger-nose-finger and heel-to-shin bilaterally.  Gait and station: Gait is normal. Tandem gait is normal. Romberg is negative. No drift is seen.  Reflexes: Deep tendon reflexes are symmetric and normal bilaterally. Toes are downgoing bilaterally.   Assessment/Plan:  1.  Bifrontal headache  The patient is having headaches now about twice a week that usually occur in the early morning.  The fact that these headaches are morning headaches and do not appear to be fully consistent with migraine, caffeine withdrawal headache should be considered.  The possibility of sleep apnea is also considered.  The patient will reduce her caffeine intake to just 1 cup of coffee daily, she will call me within the next 5 or 6 weeks if the headaches are not improving.  We may consider a MRI evaluation of the brain and initiation of daily medications at that point.  She will follow-up in 3 months.  Marlan Palau MD 10/23/2017 9:55  AM  [facility] [address] [place], [place] [address]  Phone [phone] Fax [phone]

## 2018-03-03 ENCOUNTER — Other Ambulatory Visit: Payer: Self-pay | Admitting: Obstetrics and Gynecology

## 2018-03-03 DIAGNOSIS — N644 Mastodynia: Secondary | ICD-10-CM

## 2018-03-12 ENCOUNTER — Telehealth: Payer: Self-pay | Admitting: Nurse Practitioner

## 2018-03-12 ENCOUNTER — Other Ambulatory Visit: Payer: BC Managed Care – PPO

## 2018-03-12 NOTE — Telephone Encounter (Signed)
Pt last office visit 02/12/17 with Gunnar FusiPaula.  Pt has upcoming colon 04/03/18 w Dr. Adela LankArmbruster. Pt is requesting refill of pantoprazole to [facility] as she is out. Per pt: Dr. Adela LankArmbruster wants her to continue taking pantoprazole.

## 2018-03-13 ENCOUNTER — Other Ambulatory Visit: Payer: Self-pay

## 2018-03-13 ENCOUNTER — Telehealth: Payer: Self-pay | Admitting: Nurse Practitioner

## 2018-03-13 ENCOUNTER — Other Ambulatory Visit: Payer: BC Managed Care – PPO

## 2018-03-13 MED ORDER — PANTOPRAZOLE SODIUM 40 MG PO TBEC: 40.0000 mg | DELAYED_RELEASE_TABLET | Freq: Two times a day (BID) | ORAL | 2 refills | Status: DC

## 2018-03-13 NOTE — Telephone Encounter (Signed)
Sure, okay to refill the pantoprazole. Thanks

## 2018-03-13 NOTE — Telephone Encounter (Signed)
Pt [facility] rf [facility] [place].

## 2018-07-24 ENCOUNTER — Telehealth (INDEPENDENT_AMBULATORY_CARE_PROVIDER_SITE_OTHER): Payer: BC Managed Care – PPO | Admitting: Family Medicine

## 2018-07-24 ENCOUNTER — Encounter: Payer: Self-pay | Admitting: Family Medicine

## 2018-07-24 DIAGNOSIS — F5101 Primary insomnia: Secondary | ICD-10-CM | POA: Diagnosis not present

## 2018-07-24 DIAGNOSIS — G4700 Insomnia, unspecified: Secondary | ICD-10-CM | POA: Insufficient documentation

## 2018-07-24 MED ORDER — TRAZODONE HCL 50 MG PO TABS: 50.0000 mg | ORAL_TABLET | Freq: Every evening | ORAL | 1 refills | Status: DC | PRN

## 2018-07-24 NOTE — Progress Notes (Signed)
Natalie Day - 38 y.o. female MRN [id number]  Date of birth: [birth year]   This visit type was conducted due to national recommendations for restrictions regarding the COVID-19 Pandemic (e.g. social distancing).  This format is felt to be most appropriate for this patient at this time.  All issues noted in this document were discussed and addressed.  No physical exam was performed (except for noted visual exam findings with Video Visits).  I discussed the limitations of evaluation and management by telemedicine and the availability of in person appointments. The patient expressed understanding and agreed to proceed.  I connected with@ on 07/24/18 at  9:30 AM EDT by a video enabled telemedicine application and verified that I am speaking with the correct person using two identifiers.   Patient Location: Home [address] [place] [address]   Provider location:   Home office  Chief Complaint  Patient presents with  . Insomnia    Onset : eposidic since March 13th, ruminating thoughts.wake up w/HA,wake up 4-5 / night, OTC Melatonin, oil diffusers- not working.     HPI  Natalie Day is a 38 y.o. female who presents via audio/video conferencing for a telehealth visit today.  She is being seen today for insomnia. Reports insomnia off and on for several months.  She states she doesn't have issues falling asleep but has trouble staying asleep.  She wakes 4-5x/ night.  She has tried melatonin and essential oil diffuser without much relief.  Some mild anxiety but doesn't seem overly contributory to her insomnia.     ROS:  A comprehensive ROS was completed and negative except as noted per HPI  Past Medical History:  Diagnosis Date  . Abnormal Pap smear 2001   Dysplasia, Colpo/BX, LEEP  . Amenorrhea   . Chlamydia   . HPV (human papilloma virus) infection   . Infection   . STD (sexually transmitted disease)   . Trichimoniasis     Past Surgical History:  Procedure  Laterality Date  . CERVICAL BIOPSY  W/ LOOP ELECTRODE EXCISION  2001  . CESAREAN SECTION    . COLPOSCOPY    . UPPER GASTROINTESTINAL ENDOSCOPY  15 years ago     Family History  Problem Relation Age of Onset  . Colon cancer Neg Hx   . Pancreatic cancer Neg Hx   . Esophageal cancer Neg Hx     Social History   Socioeconomic History  . Marital status: Single    Spouse name: Not on file  . Number of children: 1  . Years of education: Masters  . Highest education level: Not on file  Occupational History  . Occupation: [place] [employer]   Social Needs  . Financial resource strain: Not on file  . Food insecurity:    Worry: Not on file    Inability: Not on file  . Transportation needs:    Medical: Not on file    Non-medical: Not on file  Tobacco Use  . Smoking status: Never Smoker  . Smokeless tobacco: Never Used  Substance and Sexual Activity  . Alcohol use: No  . Drug use: No  . Sexual activity: Yes    Partners: Male    Birth control/protection: I.U.D.    Comment: Mirena  Lifestyle  . Physical activity:    Days per week: Not on file    Minutes per session: Not on file  . Stress: Not on file  Relationships  . Social connections:    Talks on phone:  Not on file    Gets together: Not on file    Attends religious service: Not on file    Active member of club or organization: Not on file    Attends meetings of clubs or organizations: Not on file    Relationship status: Not on file  . Intimate partner violence:    Fear of current or ex partner: Not on file    Emotionally abused: Not on file    Physically abused: Not on file    Forced sexual activity: Not on file  Other Topics Concern  . Not on file  Social History Narrative   Lives alone   Caffeine use: soda/tea 3x/day   Right   Handed     Current Outpatient Medications:  .  medroxyPROGESTERone (DEPO-PROVERA) 150 MG/ML injection, medroxyprogesterone 150 mg/mL intramuscular suspension  Inject 1 mL every 3 months by  intramuscular route., Disp: , Rfl:  .  Multiple Vitamin (MULTIVITAMIN) tablet, Take 1 tablet by mouth daily., Disp: , Rfl:  .  pantoprazole (PROTONIX) 40 MG tablet, Take 1 tablet (40 mg total) by mouth 2 (two) times daily before a meal., Disp: 180 tablet, Rfl: 2 .  traZODone (DESYREL) 50 MG tablet, Take 1-2 tablets (50-100 mg total) by mouth at bedtime as needed for sleep., Disp: 60 tablet, Rfl: 1  EXAM:  VITALS per patient if applicable: Pulse 83 Comment: smart watch  Ht 5\' 7"  (1.702 m)   Wt 199 lb (90.3 kg) Comment: pt reports  BMI 31.17 kg/m   GENERAL: alert, oriented, appears well and in no acute distress  HEENT: atraumatic, conjunttiva clear, no obvious abnormalities on inspection of external nose and ears  NECK: normal movements of the head and neck  LUNGS: on inspection no signs of respiratory distress, breathing rate appears normal, no obvious gross SOB, gasping or wheezing  CV: no obvious cyanosis  MS: moves all visible extremities without noticeable abnormality  PSYCH/NEURO: pleasant and cooperative, no obvious depression or anxiety, speech and thought processing grossly intact  ASSESSMENT AND PLAN:  Discussed the following assessment and plan:  Insomnia -Discussed sleep hygiene -Will provide trial of trazodone at bedtime -F/u in 1 month.        I discussed the assessment and treatment plan with the patient. The patient was provided an opportunity to ask questions and all were answered. The patient agreed with the plan and demonstrated an understanding of the instructions.   The patient was advised to call back or seek an in-person evaluation if the symptoms worsen or if the condition fails to improve as anticipated.     Everrett Coombeody Heber Hoog, DO

## 2018-07-24 NOTE — Assessment & Plan Note (Signed)
-  Discussed sleep hygiene -Will provide trial of trazodone at bedtime -F/u in 1 month.

## 2018-07-29 ENCOUNTER — Encounter: Payer: Self-pay | Admitting: Family Medicine

## 2018-07-30 NOTE — Telephone Encounter (Signed)
Recommend VV

## 2018-08-03 ENCOUNTER — Encounter: Payer: Self-pay | Admitting: Family Medicine

## 2018-08-03 ENCOUNTER — Ambulatory Visit: Payer: BC Managed Care – PPO | Admitting: Family Medicine

## 2018-08-03 DIAGNOSIS — M940 Chondrocostal junction syndrome [Tietze]: Secondary | ICD-10-CM | POA: Diagnosis not present

## 2018-08-03 MED ORDER — MELOXICAM 7.5 MG PO TABS: 7.5000 mg | ORAL_TABLET | Freq: Every day | ORAL | 0 refills | Status: DC

## 2018-08-03 NOTE — Patient Instructions (Signed)

## 2018-08-03 NOTE — Progress Notes (Signed)
Natalie Day - 38 y.o. female MRN [id number]  Date of birth: 12-29-1980  Subjective Chief Complaint  Patient presents with  . Chest Pain    Onset 3 wks , movement/lifting causes pain, episodic    HPI Natalie Day is a 38 y.o. female here today with complaint of chest pain.  Pain started 3 weeks ago after lifting some heavy items.  Pain located along upper sternal area of the chest wall.  Feels a "catching" sensation when trying to take a deep breath.  Movement or picking things up makes her pain worse.  She denies chest tightness, shortness of breath, palpitations, dizziness, fever, chills or cough.    ROS:  A comprehensive ROS was completed and negative except as noted per HPI  No Known Allergies  Past Medical History:  Diagnosis Date  . Abnormal Pap smear 2001   Dysplasia, Colpo/BX, LEEP  . Amenorrhea   . Chlamydia   . HPV (human papilloma virus) infection   . Infection   . STD (sexually transmitted disease)   . Trichimoniasis     Past Surgical History:  Procedure Laterality Date  . CERVICAL BIOPSY  W/ LOOP ELECTRODE EXCISION  2001  . CESAREAN SECTION    . COLPOSCOPY    . UPPER GASTROINTESTINAL ENDOSCOPY  15 years ago     Social History   Socioeconomic History  . Marital status: Single    Spouse name: Not on file  . Number of children: 1  . Years of education: Masters  . Highest education level: Not on file  Occupational History  . Occupation: [place] a [employer]   Social Needs  . Financial resource strain: Not on file  . Food insecurity:    Worry: Not on file    Inability: Not on file  . Transportation needs:    Medical: Not on file    Non-medical: Not on file  Tobacco Use  . Smoking status: Never Smoker  . Smokeless tobacco: Never Used  Substance and Sexual Activity  . Alcohol use: No  . Drug use: No  . Sexual activity: Yes    Partners: Male    Birth control/protection: I.U.D.    Comment: Mirena  Lifestyle  . Physical activity:    Days per week: Not on  file    Minutes per session: Not on file  . Stress: Not on file  Relationships  . Social connections:    Talks on phone: Not on file    Gets together: Not on file    Attends religious service: Not on file    Active member of club or organization: Not on file    Attends meetings of clubs or organizations: Not on file    Relationship status: Not on file  Other Topics Concern  . Not on file  Social History Narrative   Lives alone   Caffeine use: soda/tea 3x/day   Right   Handed    Family History  Problem Relation Age of Onset  . Colon cancer Neg Hx   . Pancreatic cancer Neg Hx   . Esophageal cancer Neg Hx     Health Maintenance  Topic Date Due  . TETANUS/TDAP  02/16/2015  . INFLUENZA VACCINE  10/17/2018  . PAP SMEAR-Modifier  03/18/2020  . HIV Screening  Completed    ----------------------------------------------------------------------------------------------------------------------------------------------------------------------------------------------------------------- Physical Exam BP 118/82   Pulse 72   Temp 98.6 F (37 C) (Oral)   Resp 18   Ht 5' 6.87" (1.698 m)   Wt 194 lb 6.4 oz (  88.2 kg)   SpO2 97%   BMI 30.57 kg/m   Physical Exam Constitutional:      Appearance: She is well-developed.  HENT:     Head: Normocephalic and atraumatic.  Eyes:     General: No scleral icterus. Cardiovascular:     Rate and Rhythm: Normal rate and regular rhythm.  Pulmonary:     Effort: Pulmonary effort is normal.     Breath sounds: Normal breath sounds.  Chest:     Chest wall: Tenderness (TTP along sternochondral junction on L) present.  Musculoskeletal:     Comments: ROM of shoulders and neck normal without pain.   Skin:    General: Skin is warm and dry.     Findings: No rash.  Neurological:     General: No focal deficit present.     Mental Status: She is alert.  Psychiatric:        Mood and Affect: Mood normal.        Behavior: Behavior normal.      ------------------------------------------------------------------------------------------------------------------------------------------------------------------------------------------------------------------- Assessment and Plan  Costochondritis -Recommend alternating ice and heat for comfort.  -Rx for meloxicam -Avoid exacerbating factors such as lifting.  -Call if not improving over the next couple of weeks.

## 2018-08-04 ENCOUNTER — Encounter: Payer: Self-pay | Admitting: Family Medicine

## 2018-08-06 DIAGNOSIS — M940 Chondrocostal junction syndrome [Tietze]: Secondary | ICD-10-CM | POA: Insufficient documentation

## 2018-08-06 NOTE — Assessment & Plan Note (Signed)
-  Recommend alternating ice and heat for comfort.  -Rx for meloxicam -Avoid exacerbating factors such as lifting.  -Call if not improving over the next couple of weeks.

## 2018-08-28 ENCOUNTER — Other Ambulatory Visit: Payer: Self-pay | Admitting: Family Medicine

## 2018-08-28 ENCOUNTER — Encounter: Payer: Self-pay | Admitting: Family Medicine

## 2018-08-28 DIAGNOSIS — R0789 Other chest pain: Secondary | ICD-10-CM

## 2018-08-28 NOTE — Telephone Encounter (Signed)
Lets try adding on PT.  Orders placed.

## 2018-08-31 ENCOUNTER — Emergency Department (INDEPENDENT_AMBULATORY_CARE_PROVIDER_SITE_OTHER): Payer: BC Managed Care – PPO

## 2018-08-31 ENCOUNTER — Other Ambulatory Visit: Payer: Self-pay

## 2018-08-31 ENCOUNTER — Emergency Department
Admission: EM | Admit: 2018-08-31 | Discharge: 2018-08-31 | Disposition: A | Discharge status: Home / Self Care | Payer: BC Managed Care – PPO | Source: Home / Self Care | Attending: Family Medicine | Admitting: Family Medicine

## 2018-08-31 ENCOUNTER — Encounter: Payer: Self-pay | Admitting: Emergency Medicine

## 2018-08-31 DIAGNOSIS — R079 Chest pain, unspecified: Secondary | ICD-10-CM

## 2018-08-31 DIAGNOSIS — M940 Chondrocostal junction syndrome [Tietze]: Secondary | ICD-10-CM | POA: Diagnosis not present

## 2018-08-31 MED ORDER — PREDNISONE 20 MG PO TABS: ORAL_TABLET | ORAL | 0 refills | Status: DC

## 2018-08-31 NOTE — ED Triage Notes (Addendum)
Chest pain in the center of chest was seen about one month ago for same sx/s and given Meloxicam, Protonix and Trazodone, she is not getting better, she is not using ice and heat as advised by her doctor.

## 2018-08-31 NOTE — ED Provider Notes (Signed)
[facility]    CSN: [id number] Arrival date & time: 08/31/18  0804     History   Chief Complaint Chief Complaint  Patient presents with  . Chest Pain    HPI Natalie Day is a 38 y.o. female.   Patient developed constant pain in her upper anterior chest about 6 weeks ago without preceding injury or URI.  Her pain does not radiate.  It is constant and worse with movement and inspiration.  She denies fevers, chills, and sweats, and feels well otherwise.  She was prescribed meloxicam, Protonix, and trazodone by her PCP without improvement.  The history is provided by the patient.  Chest Pain Pain location:  Substernal area Pain quality: sharp   Pain radiates to:  Does not radiate Pain severity:  Moderate Onset quality:  Sudden Duration:  6 weeks Timing:  Constant Progression:  Unchanged Chronicity:  New Context: breathing, lifting, movement and at rest   Context: not stress and not trauma   Relieved by:  Nothing Worsened by:  Coughing, deep breathing, movement and certain positions Ineffective treatments: meloxicam. Associated symptoms: no abdominal pain, no AICD problem, no anorexia, no anxiety, no back pain, no cough, no diaphoresis, no dysphagia, no fatigue, no fever, no heartburn, no lower extremity edema, no nausea, no palpitations, no PND and no shortness of breath     Past Medical History:  Diagnosis Date  . Abnormal Pap smear 2001   Dysplasia, Colpo/BX, LEEP  . Amenorrhea   . Chlamydia   . HPV (human papilloma virus) infection   . Infection   . STD (sexually transmitted disease)   . Trichimoniasis     Patient Active Problem List   Diagnosis Date Noted  . Costochondritis 08/06/2018  . Insomnia 07/24/2018  . New onset of headaches 08/18/2017  . SOB (shortness of breath) 09/05/2014  . Cough 12/08/2013  . History of loop electrical excision procedure (LEEP) 12/18/2012  . IUD (intrauterine device) in place 12/18/2012  . Irregular heart rhythm  10/17/2012  . Tension headache 09/18/2011  . GASTROESOPHAGEAL REFLUX, NO ESOPHAGITIS 05/15/2006    Past Surgical History:  Procedure Laterality Date  . CERVICAL BIOPSY  W/ LOOP ELECTRODE EXCISION  2001  . CESAREAN SECTION    . COLPOSCOPY    . UPPER GASTROINTESTINAL ENDOSCOPY  15 years ago     OB History    Gravida  2   Para  1   Term  1   Preterm      AB  1   Living  1     SAB      TAB      Ectopic      Multiple      Live Births  1            Home Medications    Prior to Admission medications   Medication Sig Start Date End Date Taking? Authorizing Provider  medroxyPROGESTERone (DEPO-PROVERA) 150 MG/ML injection medroxyprogesterone 150 mg/mL intramuscular suspension  Inject 1 mL every 3 months by intramuscular route.    [provider]  meloxicam (MOBIC) 7.5 MG tablet TAKE 1 TABLET BY MOUTH EVERY DAY 08/28/18   Everrett CoombeMatthews, Cody, DO  Multiple Vitamin (MULTIVITAMIN) tablet Take 1 tablet by mouth daily.    [provider]  pantoprazole (PROTONIX) 40 MG tablet Take 1 tablet (40 mg total) by mouth 2 (two) times daily before a meal. 03/13/18   Meredith PelGuenther, Paula M, NP  predniSONE (DELTASONE) 20 MG tablet Take one tab by  mouth twice daily for 4 days, then one daily for 3 days. Take with food. 08/31/18   Lattie HawBeese, Chenita Ruda A, MD  traZODone (DESYREL) 50 MG tablet Take 1-2 tablets (50-100 mg total) by mouth at bedtime as needed for sleep. 07/24/18   Everrett CoombeMatthews, Cody, DO    Family History Family History  Problem Relation Age of Onset  . Healthy Mother   . Healthy Father   . Colon cancer Neg Hx   . Pancreatic cancer Neg Hx   . Esophageal cancer Neg Hx     Social History Social History   Tobacco Use  . Smoking status: Never Smoker  . Smokeless tobacco: Never Used  Substance Use Topics  . Alcohol use: No  . Drug use: No     Allergies   Patient has no known allergies.   Review of Systems Review of Systems  Constitutional: Negative for  diaphoresis, fatigue and fever.  HENT: Negative for trouble swallowing.   Respiratory: Positive for chest tightness. Negative for cough, shortness of breath, wheezing and stridor.   Cardiovascular: Positive for chest pain. Negative for palpitations, leg swelling and PND.  Gastrointestinal: Negative for abdominal pain, anorexia, heartburn and nausea.  Musculoskeletal: Negative for back pain.  All other systems reviewed and are negative.    Physical Exam Triage Vital Signs ED Triage Vitals  Enc Vitals Group     BP 08/31/18 0855 131/78     Pulse Rate 08/31/18 0855 69     Resp --      Temp 08/31/18 0855 98.8 F (37.1 C)     Temp Source 08/31/18 0855 Oral     SpO2 08/31/18 0855 100 %     Weight 08/31/18 0856 195 lb (88.5 kg)     Height 08/31/18 0856 5\' 7"  (1.702 m)     Head Circumference --      Peak Flow --      Pain Score 08/31/18 0856 8     Pain Loc --      Pain Edu? --      Excl. in GC? --    No data found.  Updated Vital Signs BP 131/78 (BP Location: Right Arm)   Pulse 69   Temp 98.8 F (37.1 C) (Oral)   Ht 5\' 7"  (1.702 m)   Wt 88.5 kg   SpO2 100%   BMI 30.54 kg/m   Visual Acuity Right Eye Distance:   Left Eye Distance:   Bilateral Distance:    Right Eye Near:   Left Eye Near:    Bilateral Near:     Physical Exam Vitals signs and nursing note reviewed.  Constitutional:      General: She is not in acute distress. HENT:     Head: Normocephalic.     Right Ear: External ear normal.     Left Ear: External ear normal.     Nose: Nose normal.     Mouth/Throat:     Pharynx: Oropharynx is clear.  Eyes:     Conjunctiva/sclera: Conjunctivae normal.     Pupils: Pupils are equal, round, and reactive to light.  Neck:     Musculoskeletal: Normal range of motion.  Cardiovascular:     Rate and Rhythm: Normal rate.     Heart sounds: Normal heart sounds.  Pulmonary:     Effort: Pulmonary effort is normal.     Breath sounds: Normal breath sounds. No wheezing,  rhonchi or rales.  Chest:     Chest wall: Tenderness present.  Comments:  Distinct tenderness to palpation over the upper sternum as noted on diagram.  Palpation there recreates her pain. Abdominal:     General: Abdomen is flat.     Palpations: Abdomen is soft.     Tenderness: There is no abdominal tenderness.  Musculoskeletal:     Right lower leg: No edema.     Left lower leg: No edema.  Lymphadenopathy:     Cervical: No cervical adenopathy.  Skin:    General: Skin is warm and dry.     Findings: No rash.  Neurological:     Mental Status: She is alert.      UC Treatments / Results  Labs (all labs ordered are listed, but only abnormal results are displayed) Labs Reviewed - No data to display  EKG  Rate:  58 BPM PR:  186 msec QT:  420 msec QTcH:  412 msec QRSD:  74 msec QRS axis:  51 degrees Interpretation:  Sinus bradycardia; no acute changes.  Review of past records:  Today's EKG essentially unchanged from EKG done 16 Oct 2012.  Radiology Dg Chest 2 View  Result Date: 08/31/2018 CLINICAL DATA:  Chest pain for 6 weeks. EXAM: CHEST - 2 VIEW COMPARISON:  07/23/2010 FINDINGS: The heart size and mediastinal contours are within normal limits. Both lungs are clear. The visualized skeletal structures are unremarkable. IMPRESSION: Negative.  No active cardiopulmonary disease. Electronically Signed   By: Earle Gell M.D.   On: 08/31/2018 09:45    Procedures Procedures (including critical care time)  Medications Ordered in UC Medications - No data to display  Initial Impression / Assessment and Plan / UC Course  I have reviewed the triage vital signs and the nursing notes.  Pertinent labs & imaging results that were available during my care of the patient were reviewed by me and considered in my medical decision making (see chart for details).    Negative chest x-ray and EKG reassuring. Begin prednisone burst/taper. If not improved about 2 weeks, recommend  follow-up with [facility].   Final Clinical Impressions(s) / UC Diagnoses   Final diagnoses:  Costochondritis     Discharge Instructions     Apply ice pack for 20 to 30 minutes, 3 to 4 times daily  Continue until pain decreases.  May take Tylenol as needed for pain.       ED Prescriptions    Medication Sig Dispense Auth. Provider   predniSONE (DELTASONE) 20 MG tablet Take one tab by mouth twice daily for 4 days, then one daily for 3 days. Take with food. 11 tablet Kandra Nicolas, MD         Kandra Nicolas, MD 08/31/18 1049

## 2018-08-31 NOTE — Discharge Instructions (Signed)
Apply ice pack for 20 to 30 minutes, 3 to 4 times daily  Continue until pain decreases.  May take Tylenol as needed for pain. 

## 2018-09-07 ENCOUNTER — Other Ambulatory Visit: Payer: Self-pay | Admitting: *Deleted

## 2018-09-07 DIAGNOSIS — Z20822 Contact with and (suspected) exposure to covid-19: Secondary | ICD-10-CM

## 2018-09-09 NOTE — Addendum Note (Signed)
Addended by: Brena Windsor M on: 09/09/2018 09:41 PM   Modules accepted: Orders  

## 2019-03-19 ENCOUNTER — Other Ambulatory Visit: Payer: Self-pay | Admitting: Family Medicine

## 2019-03-22 NOTE — Telephone Encounter (Signed)
Left pt. vm

## 2019-03-22 NOTE — Telephone Encounter (Signed)
Patient is aware of dose change. Patient verbalized understanding.

## 2019-03-22 NOTE — Telephone Encounter (Signed)
Patient is returning call. Call back [phone]

## 2019-03-22 NOTE — Telephone Encounter (Signed)
Please let patient know that I received refill request for protonix.  I would recommend we try reducing the dose of this to 40mg  daily rather than bid.

## 2019-05-06 ENCOUNTER — Ambulatory Visit: Payer: BC Managed Care – PPO

## 2019-05-10 ENCOUNTER — Ambulatory Visit: Payer: BC Managed Care – PPO | Attending: Internal Medicine

## 2019-05-27 ENCOUNTER — Ambulatory Visit: Payer: BC Managed Care – PPO | Attending: Family

## 2019-05-27 DIAGNOSIS — Z23 Encounter for immunization: Secondary | ICD-10-CM

## 2019-05-27 NOTE — Progress Notes (Signed)
   Covid-19 Vaccination Clinic  Name:  Natalie Day    MRN: [id number] DOB: 11/30/1980  05/27/2019  [name] was observed post Covid-19 immunization for 15 minutes without incident. She was provided with Vaccine Information Sheet and instruction to access the V-Safe system.   [name] was instructed to call 911 with any severe reactions post vaccine: Marland Kitchen Difficulty breathing  . Swelling of face and throat  . A fast heartbeat  . A bad rash all over body  . Dizziness and weakness   Immunizations Administered    Name Date Dose VIS Date Route   Moderna COVID-19 Vaccine 05/27/2019 12:16 PM 0.5 mL 02/16/2019 Intramuscular   Manufacturer: Moderna   Lot: [id number]   NDC: 71855-015-86

## 2019-06-29 ENCOUNTER — Ambulatory Visit: Payer: BC Managed Care – PPO | Attending: Family

## 2019-06-29 DIAGNOSIS — Z23 Encounter for immunization: Secondary | ICD-10-CM

## 2019-06-29 NOTE — Progress Notes (Signed)
   Covid-19 Vaccination Clinic  Name:  Charly Hunton    MRN: [id number] DOB: 20-Nov-1980  06/29/2019  Ms. Lax was observed post Covid-19 immunization for 15 minutes without incident. She was provided with Vaccine Information Sheet and instruction to access the V-Safe system.   Ms. Maheu was instructed to call 911 with any severe reactions post vaccine: Marland Kitchen Difficulty breathing  . Swelling of face and throat  . A fast heartbeat  . A bad rash all over body  . Dizziness and weakness   Immunizations Administered    Name Date Dose VIS Date Route   Moderna COVID-19 Vaccine 06/29/2019 11:22 AM 0.5 mL 02/16/2019 Intramuscular   Manufacturer: Moderna   Lot: [id number]   NDC: 27639-432-00

## 2019-09-08 ENCOUNTER — Encounter: Payer: Self-pay | Admitting: Physician Assistant

## 2019-09-08 ENCOUNTER — Ambulatory Visit (INDEPENDENT_AMBULATORY_CARE_PROVIDER_SITE_OTHER): Payer: BC Managed Care – PPO | Admitting: Physician Assistant

## 2019-09-08 ENCOUNTER — Ambulatory Visit: Payer: BC Managed Care – PPO | Admitting: Family Medicine

## 2019-09-08 VITALS — BP 134/91 | HR 78 | Ht 67.0 in | Wt 223.0 lb

## 2019-09-08 DIAGNOSIS — Z23 Encounter for immunization: Secondary | ICD-10-CM | POA: Diagnosis not present

## 2019-09-08 DIAGNOSIS — K2100 Gastro-esophageal reflux disease with esophagitis, without bleeding: Secondary | ICD-10-CM

## 2019-09-08 DIAGNOSIS — Z6834 Body mass index (BMI) 34.0-34.9, adult: Secondary | ICD-10-CM

## 2019-09-08 DIAGNOSIS — E6609 Other obesity due to excess calories: Secondary | ICD-10-CM

## 2019-09-08 DIAGNOSIS — Z13220 Encounter for screening for lipoid disorders: Secondary | ICD-10-CM

## 2019-09-08 DIAGNOSIS — Z131 Encounter for screening for diabetes mellitus: Secondary | ICD-10-CM

## 2019-09-08 MED ORDER — PANTOPRAZOLE SODIUM 40 MG PO TBEC: 40.0000 mg | DELAYED_RELEASE_TABLET | Freq: Two times a day (BID) | ORAL | 5 refills | Status: DC

## 2019-09-08 MED ORDER — FAMOTIDINE 20 MG PO TABS: 20.0000 mg | ORAL_TABLET | Freq: Two times a day (BID) | ORAL | 2 refills | Status: DC

## 2019-09-08 NOTE — Patient Instructions (Addendum)
Esophageal Spasm  An esophageal spasm is a sudden tightening (contraction) of the part of the body that moves food from the mouth to the stomach (esophagus). Normally, smooth, wave-like muscle contractions move food and liquids down the esophagus. Esophageal spasms are abnormal muscle contractions that can cause chest pain and trouble swallowing (dysphagia). Spasms may also cause swallowed foods or liquids to come back up into the throat (regurgitation). There are two types of esophageal spasms. You may have one or both types:  Diffuse esophageal spasms. These are irregular, uncoordinated spasms. This type tends to cause more dysphagia.  Nutcracker esophagus. This is a type of spasm in which the muscles move normally, but the contraction is very strong. This type tends to be more painful. Severe esophageal spasms can make it hard to eat and do everyday activities. They often occur with severe heartburn (reflux esophagitis). The symptoms can come and go and may be triggered or worsened depending on your diet or other medical issues. What are the causes? The cause of esophageal spasms is not known. What increases the risk? The following factors may make you more likely to develop esophageal spasms:  Being female.  Age. The risk may increase as you get older.  Depression or anxiety.  Having GERD (gastroesophageal reflux disease). What are the signs or symptoms? Symptoms may vary from day to day. They may be mild or severe. They may last for minutes or hours. Common symptoms include:  Chest pain. This may feel like a heart attack.  Back pain.  Dysphagia.  Heartburn.  A feeling that something is stuck in the throat (globus).  Regurgitation of foods or liquids. For some people, certain things may trigger symptoms, such as:  Certain foods and drinks. These may include very hot or very cold foods or drinks.  Eating very quickly. How is this diagnosed? This condition may be diagnosed  based on your symptoms and a physical exam. You may have tests, such as:  Endoscopy. This involves using a flexible tube that has a camera on the end of it (endoscope) to look down your throat and examine your esophagus.  Barium swallow. This involves drinking a substance that will show up well on X-rays (barium) and then having X-rays to see how the substance moves through your esophagus.  Esophageal manometry. This involves passing a small, thin tube through your nose and down into your throat. The tube contains pressure sensors that measure muscle contractions in the esophagus while you swallow. How is this treated? Mild esophageal spasms may not need treatment. You may be able to manage the spasms by avoiding triggers. For more frequent or severe spasms, treatment may include:  Medicine to: ? Relax the esophageal muscles. ? Relieve muscle spasms (calcium channel blockers and nitrates). ? Relieve pain by blocking nerve endings in the esophagus. This is done with an injection of a toxin (botulinum). ? Relieve heartburn (proton pump inhibitors).  Antidepressant medicines. These are sometimes used to ease symptoms.  Surgery to reduce esophageal muscle contractions (myotomy), in very severe cases. Follow these instructions at home: Eating and drinking  Keep track of foods, drinks, and habits that trigger spasms or heartburn. Avoid these triggers as much as you can.  Eat meals slowly. Chew food completely before swallowing.  Avoid swallowing foods and drinks when they are very hot or very cold. General instructions  Take over-the-counter and prescription medicines only as told by your health care provider.  Find ways to manage stress, such as regular exercise   or meditation.  If you struggle with depression or anxiety, talk with your health care provider about treatment options.  Keep all follow-up visits as told by your health care provider. This is important. Contact a health care  provider if:  Your symptoms get worse or do not get better with medicine.  You are losing weight because of dysphagia.  Your esophageal spasms affect your quality of life, such as your ability to eat. Get help right away if:  You have severe chest pain.  You have chest pain that is different from your usual chest pain.  You have trouble breathing.  You choke. Summary  An esophageal spasm is a sudden tightening (contraction) of the part of the body that moves food from the mouth to the stomach (esophagus). These abnormal muscle contractions can cause chest pain and trouble swallowing (dysphagia).  The cause of esophageal spasms is not known.  Treatment may not be needed for mild spasms. For frequent or more severe spasms, treatment may include medicine, or, for very severe spasms, surgery.  Keep track of foods, drinks, and habits that trigger spasms or heartburn. Avoid these triggers as much as you can. This information is not intended to replace advice given to you by your health care provider. Make sure you discuss any questions you have with your health care provider. Document Revised: 02/14/2017 Document Reviewed: 12/03/2016 Elsevier Patient Education  2020 [employer] Inc[employer]  

## 2019-09-08 NOTE — Progress Notes (Signed)
Subjective:    Patient ID: Natalie Day, female    DOB: 1981-02-25, 39 y.o.   MRN: [id number]  HPI  Patient is a 39 year old obese female with GERD who presents to the clinic with worsening GERD.  Recently she is also having some what feels like esophageal spasms.  She feels like a squeezing tightening in her esophagus for like 15 seconds where she cannot breathe or swallow.  It then goes away.  She is having to sleep on 3 pillows at night. She used to be on protonix bid but Dr. Zigmund Daniel decreased to qd. She has noticed a lot more GERD problems since then.  She does admit to a 30 pound weight gain in the last year due to switching her Mirena to Depo-Provera for birth control.  She has stopped Depo for this reason.  She is currently not sexually active.  She denies any epigastric pain or pain radiating into back.  At times she does feel little nauseated but does not vomit.  She denies any melena or hematochezia.  Marland Kitchen. Active Ambulatory Problems    Diagnosis Date Noted  . GASTROESOPHAGEAL REFLUX, NO ESOPHAGITIS 05/15/2006  . Tension headache 09/18/2011  . Irregular heart rhythm 10/17/2012  . History of loop electrical excision procedure (LEEP) 12/18/2012  . IUD (intrauterine device) in place 12/18/2012  . Cough 12/08/2013  . SOB (shortness of breath) 09/05/2014  . New onset of headaches 08/18/2017  . Insomnia 07/24/2018  . Costochondritis 08/06/2018  . Class 1 obesity due to excess calories without serious comorbidity with body mass index (BMI) of 34.0 to 34.9 in adult 09/13/2019   Resolved Ambulatory Problems    Diagnosis Date Noted  . TOBACCO USER 11/26/2008  . Constipation 05/15/2006  . DE QUERVAIN'S TENOSYNOVITIS 04/14/2007  . VAGINITIS 03/21/2010  . Conjunctivitis 02/13/2011  . Carpal tunnel syndrome 12/11/2011   Past Medical History:  Diagnosis Date  . Abnormal Pap smear 2001  . Amenorrhea   . Chlamydia   . HPV (human papilloma virus) infection   . Infection   . STD  (sexually transmitted disease)   . Trichimoniasis       Review of Systems  All other systems reviewed and are negative.      Objective:   Physical Exam Vitals reviewed.  Constitutional:      Appearance: Normal appearance. She is obese.  Cardiovascular:     Rate and Rhythm: Normal rate and regular rhythm.     Pulses: Normal pulses.  Pulmonary:     Effort: Pulmonary effort is normal.     Breath sounds: Normal breath sounds.  Abdominal:     General: Bowel sounds are normal. There is no distension.     Palpations: Abdomen is soft.     Tenderness: There is no abdominal tenderness. There is no guarding.  Neurological:     General: No focal deficit present.     Mental Status: She is alert and oriented to person, place, and time.  Psychiatric:        Mood and Affect: Mood normal.        Behavior: Behavior normal.           Assessment & Plan:  Marland KitchenMarland KitchenDeann was seen today for gastroesophageal reflux and anxiety.  Diagnoses and all orders for this visit:  Gastroesophageal reflux disease with esophagitis without hemorrhage -     pantoprazole (PROTONIX) 40 MG tablet; Take 1 tablet (40 mg total) by mouth 2 (two) times daily before a meal. -  CBC -     famotidine (PEPCID) 20 MG tablet; Take 1 tablet (20 mg total) by mouth 2 (two) times daily. -     Ambulatory referral to Gastroenterology  Need for Tdap vaccination -     Tdap vaccine greater than or equal to 7yo IM  Screening for lipid disorders -     Lipid Panel w/reflex Direct LDL  Screening for diabetes mellitus -     COMPLETE METABOLIC PANEL WITH GFR  Class 1 obesity due to excess calories without serious comorbidity with body mass index (BMI) of 34.0 to 34.9 in adult -     TSH   Patient did need some health screenings done today.  We did update her tetanus.  I would like to get some labs fasting.  Does somewhat she could be having esophageal spasm due to all her reflux.  I think it is time she sees a  gastroenterologist. Increased protonix to bid for the time being and added pepcid.  Discussed GERD diet.  Discussed warning signs of reflux.   Discussed weight loss to help with GERD and overall help. Follow up with PCP for plan.   Spent 30 minutes with patient.

## 2019-09-09 LAB — COMPLETE METABOLIC PANEL WITH GFR
AG Ratio: 1.8 (calc) (ref 1.0–2.5)
ALT: 31 U/L — ABNORMAL HIGH (ref 6–29)
AST: 22 U/L (ref 10–30)
Albumin: 4.5 g/dL (ref 3.6–5.1)
Alkaline phosphatase (APISO): 76 U/L (ref 31–125)
BUN: 9 mg/dL (ref 7–25)
CO2: 30 mmol/L (ref 20–32)
Calcium: 10 mg/dL (ref 8.6–10.2)
Chloride: 105 mmol/L (ref 98–110)
Creat: 0.79 mg/dL (ref 0.50–1.10)
GFR, Est African American: 108 mL/min/{1.73_m2} (ref >=60)
GFR, Est Non African American: 93 mL/min/{1.73_m2} (ref >=60)
Globulin: 2.5 g/dL (calc) (ref 1.9–3.7)
Glucose, Bld: 92 mg/dL (ref 65–99)
Potassium: 4.2 mmol/L (ref 3.5–5.3)
Sodium: 140 mmol/L (ref 135–146)
Total Bilirubin: 0.5 mg/dL (ref 0.2–1.2)
Total Protein: 7 g/dL (ref 6.1–8.1)

## 2019-09-09 LAB — TSH: TSH: 1.34 mIU/L

## 2019-09-09 LAB — CBC
HCT: 41.3 % (ref 35.0–45.0)
Hemoglobin: 13.6 g/dL (ref 11.7–15.5)
MCH: 28.8 pg (ref 27.0–33.0)
MCHC: 32.9 g/dL (ref 32.0–36.0)
MCV: 87.3 fL (ref 80.0–100.0)
MPV: 13.7 fL — ABNORMAL HIGH (ref 7.5–12.5)
Platelets: 146 10*3/uL (ref 140–400)
RBC: 4.73 10*6/uL (ref 3.80–5.10)
RDW: 13.1 % (ref 11.0–15.0)
WBC: 6.8 10*3/uL (ref 3.8–10.8)

## 2019-09-09 LAB — LIPID PANEL W/REFLEX DIRECT LDL
Cholesterol: 190 mg/dL (ref <200)
HDL: 37 mg/dL — ABNORMAL LOW (ref >=50)
LDL Cholesterol (Calc): 124 mg/dL (calc) — ABNORMAL HIGH
Non-HDL Cholesterol (Calc): 153 mg/dL (calc) — ABNORMAL HIGH (ref <130)
Total CHOL/HDL Ratio: 5.1 (calc) — ABNORMAL HIGH (ref <5.0)
Triglycerides: 177 mg/dL — ABNORMAL HIGH (ref <150)

## 2019-09-10 NOTE — Progress Notes (Signed)
Natalie Day,   Thyroid normal.  No anemia.  Kidney GREAT.  One liver enyzme just a little elevated. Will continue to keep an eye on this.likely will recheck with GI.  Cholesterol not to goal. Low fat diet and regular exercise could help. Will continue to monitor.

## 2019-09-13 DIAGNOSIS — E6609 Other obesity due to excess calories: Secondary | ICD-10-CM | POA: Insufficient documentation

## 2019-09-13 DIAGNOSIS — Z6834 Body mass index (BMI) 34.0-34.9, adult: Secondary | ICD-10-CM | POA: Insufficient documentation

## 2019-09-24 ENCOUNTER — Encounter: Payer: Self-pay | Admitting: Family Medicine

## 2019-09-26 ENCOUNTER — Emergency Department (HOSPITAL_BASED_OUTPATIENT_CLINIC_OR_DEPARTMENT_OTHER)
Admission: EM | Admit: 2019-09-26 | Discharge: 2019-09-26 | Disposition: A | Discharge status: Home / Self Care | Payer: BC Managed Care – PPO | Attending: Emergency Medicine | Admitting: Emergency Medicine

## 2019-09-26 ENCOUNTER — Emergency Department (HOSPITAL_BASED_OUTPATIENT_CLINIC_OR_DEPARTMENT_OTHER): Payer: BC Managed Care – PPO

## 2019-09-26 ENCOUNTER — Encounter (HOSPITAL_BASED_OUTPATIENT_CLINIC_OR_DEPARTMENT_OTHER): Payer: Self-pay | Admitting: Emergency Medicine

## 2019-09-26 ENCOUNTER — Other Ambulatory Visit: Payer: Self-pay

## 2019-09-26 DIAGNOSIS — R1030 Lower abdominal pain, unspecified: Secondary | ICD-10-CM | POA: Diagnosis not present

## 2019-09-26 DIAGNOSIS — M545 Low back pain, unspecified: Secondary | ICD-10-CM

## 2019-09-26 DIAGNOSIS — N3000 Acute cystitis without hematuria: Secondary | ICD-10-CM | POA: Insufficient documentation

## 2019-09-26 DIAGNOSIS — R350 Frequency of micturition: Secondary | ICD-10-CM | POA: Diagnosis present

## 2019-09-26 LAB — PREGNANCY, URINE: Preg Test, Ur: NEGATIVE

## 2019-09-26 LAB — URINALYSIS, ROUTINE W REFLEX MICROSCOPIC
Bilirubin Urine: NEGATIVE
Glucose, UA: NEGATIVE mg/dL
Hgb urine dipstick: NEGATIVE
Ketones, ur: NEGATIVE mg/dL
Nitrite: NEGATIVE
Protein, ur: NEGATIVE mg/dL
Specific Gravity, Urine: >1.03 — ABNORMAL HIGH (ref 1.005–1.030)
pH: 5 (ref 5.0–8.0)

## 2019-09-26 LAB — COMPREHENSIVE METABOLIC PANEL
ALT: 43 U/L (ref 0–44)
AST: 29 U/L (ref 15–41)
Albumin: 4.3 g/dL (ref 3.5–5.0)
Alkaline Phosphatase: 67 U/L (ref 38–126)
Anion gap: 10 (ref 5–15)
BUN: 11 mg/dL (ref 6–20)
CO2: 24 mmol/L (ref 22–32)
Calcium: 9.1 mg/dL (ref 8.9–10.3)
Chloride: 107 mmol/L (ref 98–111)
Creatinine, Ser: 0.73 mg/dL (ref 0.44–1.00)
GFR calc Af Amer: >60 mL/min (ref >60)
GFR calc non Af Amer: >60 mL/min (ref >60)
Glucose, Bld: 104 mg/dL — ABNORMAL HIGH (ref 70–99)
Potassium: 3.6 mmol/L (ref 3.5–5.1)
Sodium: 141 mmol/L (ref 135–145)
Total Bilirubin: 0.6 mg/dL (ref 0.3–1.2)
Total Protein: 7.1 g/dL (ref 6.5–8.1)

## 2019-09-26 LAB — CBC WITH DIFFERENTIAL/PLATELET
Abs Immature Granulocytes: 0.04 10*3/uL (ref 0.00–0.07)
Basophils Absolute: 0.1 10*3/uL (ref 0.0–0.1)
Basophils Relative: 1 %
Eosinophils Absolute: 0.1 10*3/uL (ref 0.0–0.5)
Eosinophils Relative: 2 %
HCT: 39.8 % (ref 36.0–46.0)
Hemoglobin: 12.8 g/dL (ref 12.0–15.0)
Immature Granulocytes: 1 %
Lymphocytes Relative: 28 %
Lymphs Abs: 2.1 10*3/uL (ref 0.7–4.0)
MCH: 27.9 pg (ref 26.0–34.0)
MCHC: 32.2 g/dL (ref 30.0–36.0)
MCV: 86.9 fL (ref 80.0–100.0)
Monocytes Absolute: 0.6 10*3/uL (ref 0.1–1.0)
Monocytes Relative: 7 %
Neutro Abs: 4.6 10*3/uL (ref 1.7–7.7)
Neutrophils Relative %: 61 %
Platelets: 145 10*3/uL — ABNORMAL LOW (ref 150–400)
RBC: 4.58 MIL/uL (ref 3.87–5.11)
RDW: 13.4 % (ref 11.5–15.5)
WBC: 7.4 10*3/uL (ref 4.0–10.5)
nRBC: 0 % (ref 0.0–0.2)

## 2019-09-26 LAB — URINALYSIS, MICROSCOPIC (REFLEX)

## 2019-09-26 LAB — LIPASE, BLOOD: Lipase: 25 U/L (ref 11–51)

## 2019-09-26 MED ORDER — CEPHALEXIN 500 MG PO CAPS
500.0000 mg | ORAL_CAPSULE | Freq: Four times a day (QID) | ORAL | 0 refills | Status: AC
Start: 2019-09-26 — End: 2019-10-03

## 2019-09-26 MED ORDER — METHOCARBAMOL 500 MG PO TABS
500.0000 mg | ORAL_TABLET | Freq: Two times a day (BID) | ORAL | 0 refills | Status: DC
Start: 2019-09-26 — End: 2019-10-21

## 2019-09-26 MED ORDER — CEPHALEXIN 250 MG PO CAPS
500.0000 mg | ORAL_CAPSULE | Freq: Once | ORAL | Status: AC
  Administered 2019-09-26: 500 mg via ORAL
  Filled 2019-09-26: qty 2

## 2019-09-26 MED ORDER — KETOROLAC TROMETHAMINE 30 MG/ML IJ SOLN
30.0000 mg | Freq: Once | INTRAMUSCULAR | Status: AC
  Administered 2019-09-26: 30 mg via INTRAVENOUS
  Filled 2019-09-26: qty 1

## 2019-09-26 NOTE — ED Triage Notes (Signed)
Patient states that she was treated for a UTI last week with Cipro. The patient states that she continues to feel like she has a UTI - she reports that's that she is having frequency, stomach and back pain with blood in her urine. Denies any N/V

## 2019-09-26 NOTE — ED Provider Notes (Signed)
[facility] EMERGENCY DEPARTMENT Provider Note   CSN: [id number] Arrival date & time: 09/26/19  1324     History Chief Complaint  Patient presents with  . Hematuria    Natalie Day is a 39 y.o. female who presents for evaluation of increased urinary frequency, lower back pain, lower abdominal pain that has been ongoing for about 6 days.  She reports that initially she started having symptoms 6 days ago.  She thought that she had a UTI and she called her OB/GYN who called her in Cipro.  She states that despite taking the antibiotics, she was still having symptoms.  She went to urgent care the next day and was told that she had a UTI.  She had a urine culture sent.  She reports that she received a phone call and stated that the urine culture was negative and that she should discontinue the antibiotics.  She did finish the course of Cipro.  Patient reports that she has continued to have increased urinary frequency.  She states that when she is going, it is not a large volume.  She has not noted any dysuria or hematuria.  She does report that the nurse at urgent care told her that she had blood in her urine but she has not visualized any blood in her urine.  She also reports that she is continue to have some lower back pain as well as some lower abdominal pain.  She has not noted any fever and has not any nausea/vomiting.  She has been able to tolerate p.o. without any difficulty.  She denies any chest pain, difficulty breathing, abdominal pain, vaginal bleeding, vaginal discharge.  She is not currently sexually active.  The history is provided by the patient.       Past Medical History:  Diagnosis Date  . Abnormal Pap smear 2001   Dysplasia, Colpo/BX, LEEP  . Amenorrhea   . Chlamydia   . HPV (human papilloma virus) infection   . Infection   . STD (sexually transmitted disease)   . Trichimoniasis     Patient Active Problem List   Diagnosis Date Noted  . Class 1 obesity due  to excess calories without serious comorbidity with body mass index (BMI) of 34.0 to 34.9 in adult 09/13/2019  . Costochondritis 08/06/2018  . Insomnia 07/24/2018  . New onset of headaches 08/18/2017  . SOB (shortness of breath) 09/05/2014  . Cough 12/08/2013  . History of loop electrical excision procedure (LEEP) 12/18/2012  . IUD (intrauterine device) in place 12/18/2012  . Irregular heart rhythm 10/17/2012  . Tension headache 09/18/2011  . GASTROESOPHAGEAL REFLUX, NO ESOPHAGITIS 05/15/2006    Past Surgical History:  Procedure Laterality Date  . CERVICAL BIOPSY  W/ LOOP ELECTRODE EXCISION  2001  . CESAREAN SECTION    . COLPOSCOPY    . UPPER GASTROINTESTINAL ENDOSCOPY  15 years ago      OB History    Gravida  2   Para  1   Term  1   Preterm      AB  1   Living  1     SAB      TAB      Ectopic      Multiple      Live Births  1           Family History  Problem Relation Age of Onset  . Healthy Mother   . Healthy Father   . Colon cancer Neg Hx   .  Pancreatic cancer Neg Hx   . Esophageal cancer Neg Hx     Social History   Tobacco Use  . Smoking status: Never Smoker  . Smokeless tobacco: Never Used  Vaping Use  . Vaping Use: Never used  Substance Use Topics  . Alcohol use: No  . Drug use: No    Home Medications Prior to Admission medications   Medication Sig Start Date End Date Taking? Authorizing Provider  cephALEXin (KEFLEX) 500 MG capsule Take 1 capsule (500 mg total) by mouth 4 (four) times daily for 7 days. 09/26/19 10/03/19  Maxwell Caul, PA-C  famotidine (PEPCID) 20 MG tablet Take 1 tablet (20 mg total) by mouth 2 (two) times daily. 09/08/19   Breeback, Lonna Cobb, PA-C  methocarbamol (ROBAXIN) 500 MG tablet Take 1 tablet (500 mg total) by mouth 2 (two) times daily. 09/26/19   Maxwell Caul, PA-C  pantoprazole (PROTONIX) 40 MG tablet Take 1 tablet (40 mg total) by mouth 2 (two) times daily before a meal. 09/08/19   Breeback, Jade L, PA-C   traZODone (DESYREL) 50 MG tablet Take 1-2 tablets (50-100 mg total) by mouth at bedtime as needed for sleep. 07/24/18   Everrett Coombe, DO    Allergies    Patient has no known allergies.  Review of Systems   Review of Systems  Constitutional: Negative for fever.  Respiratory: Negative for cough and shortness of breath.   Cardiovascular: Negative for chest pain.  Gastrointestinal: Positive for abdominal pain. Negative for nausea and vomiting.  Genitourinary: Positive for flank pain and frequency. Negative for dysuria and hematuria.  Musculoskeletal: Positive for back pain.  Neurological: Negative for headaches.  All other systems reviewed and are negative.   Physical Exam Updated Vital Signs BP (!) 128/92 (BP Location: Left Arm)   Pulse 75   Temp 98.4 F (36.9 C) (Oral)   Resp 18   Wt 102.1 kg   SpO2 100%   BMI 35.24 kg/m   Physical Exam Vitals and nursing note reviewed.  Constitutional:      Appearance: Normal appearance. She is well-developed.  HENT:     Head: Normocephalic and atraumatic.  Eyes:     General: Lids are normal.     Conjunctiva/sclera: Conjunctivae normal.     Pupils: Pupils are equal, round, and reactive to light.  Cardiovascular:     Rate and Rhythm: Normal rate and regular rhythm.     Pulses: Normal pulses.     Heart sounds: Normal heart sounds. No murmur heard.  No friction rub. No gallop.   Pulmonary:     Effort: Pulmonary effort is normal.     Breath sounds: Normal breath sounds.  Abdominal:     Palpations: Abdomen is soft. Abdomen is not rigid.     Tenderness: There is abdominal tenderness in the suprapubic area. There is no right CVA tenderness, left CVA tenderness or guarding.     Comments: Abdomen soft, nondistended.  Mild suprapubic abdominal tenderness.  No CVA tenderness elicited on palpation but patient does state that she occasionally will have pain in that same area.  Musculoskeletal:        General: Normal range of motion.      Cervical back: Full passive range of motion without pain.  Skin:    General: Skin is warm and dry.     Capillary Refill: Capillary refill takes less than 2 seconds.  Neurological:     Mental Status: She is alert and oriented to person, place,  and time.  Psychiatric:        Speech: Speech normal.     ED Results / Procedures / Treatments   Labs (all labs ordered are listed, but only abnormal results are displayed) Labs Reviewed  URINALYSIS, ROUTINE W REFLEX MICROSCOPIC - Abnormal; Notable for the following components:      Result Value   Color, Urine AMBER (*)    APPearance CLOUDY (*)    Specific Gravity, Urine >1.030 (*)    Leukocytes,Ua TRACE (*)    All other components within normal limits  URINALYSIS, MICROSCOPIC (REFLEX) - Abnormal; Notable for the following components:   Bacteria, UA MANY (*)    All other components within normal limits  COMPREHENSIVE METABOLIC PANEL - Abnormal; Notable for the following components:   Glucose, Bld 104 (*)    All other components within normal limits  CBC WITH DIFFERENTIAL/PLATELET - Abnormal; Notable for the following components:   Platelets 145 (*)    All other components within normal limits  URINE CULTURE  PREGNANCY, URINE  LIPASE, BLOOD    EKG None  Radiology CT Renal Stone Study  Result Date: 09/26/2019 CLINICAL DATA:  Pain. EXAM: CT ABDOMEN AND PELVIS WITHOUT CONTRAST TECHNIQUE: Multidetector CT imaging of the abdomen and pelvis was performed following the standard protocol without IV contrast. COMPARISON:  None. FINDINGS: Lower chest: The lung bases are clear. The heart size is normal. Hepatobiliary: There is decreased hepatic attenuation suggestive of hepatic steatosis. Normal gallbladder.There is no biliary ductal dilation. Pancreas: Normal contours without ductal dilatation. No peripancreatic fluid collection. Spleen: Unremarkable. Adrenals/Urinary Tract: --Adrenal glands: Unremarkable. --Right kidney/ureter: No hydronephrosis  or radiopaque kidney stones. --Left kidney/ureter: No hydronephrosis or radiopaque kidney stones. --Urinary bladder: Unremarkable. Stomach/Bowel: --Stomach/Duodenum: No hiatal hernia or other gastric abnormality. Normal duodenal course and caliber. --Small bowel: Unremarkable. --Colon: Unremarkable. --Appendix: Normal. Vascular/Lymphatic: Normal course and caliber of the major abdominal vessels. --No retroperitoneal lymphadenopathy. --No mesenteric lymphadenopathy. --No pelvic or inguinal lymphadenopathy. Reproductive: Unremarkable Other: No ascites or free air. There is a fat containing umbilical hernia. Musculoskeletal. No acute displaced fractures. IMPRESSION: 1. No acute abdominopelvic abnormality. 2. Hepatic steatosis. 3. Fat containing umbilical hernia. Electronically Signed   By: Katherine Mantle M.D.   On: 09/26/2019 15:41    Procedures Procedures (including critical care time)  Medications Ordered in ED Medications  ketorolac (TORADOL) 30 MG/ML injection 30 mg (30 mg Intravenous Given 09/26/19 1533)  cephALEXin (KEFLEX) capsule 500 mg (500 mg Oral Given 09/26/19 1634)    ED Course  I have reviewed the triage vital signs and the nursing notes.  Pertinent labs & imaging results that were available during my care of the patient were reviewed by me and considered in my medical decision making (see chart for details).    MDM Rules/Calculators/A&P                          39 year old female who presents for evaluation of increased urinary frequency, low back pain, lower abdominal pain x6 days.  Had been on antibiotic for UTI.  She had a urine culture that they said was negative and was told to stop the UTI.  She is still having symptoms.  She is taking Azo.  On initially arrival, she is afebrile, nontoxic-appearing.  Vital signs are stable.  On exam, she has some mild suprapubic abdominal tenderness.  No focal CVA tenderness when I palpate but she does state that that has been the area that  has been hurting.  Given that this is her third visit, will plan for basic labs, urine.  Consider UTI versus GU etiology versus kidney stone versus infectious process.  Lipase is normal.  CMP shows normal BUN and creatinine.  CBC shows no leukocytosis or anemia.  UA shows trace leukocytes.  There is pyuria and bacteria.  There is squamous epithelium so question of the some contaminant.  Urine pregnancy negative.  CT scan shows no acute done pelvic abnormality.  She does have hepatic steatosis.  Fat-containing umbilical hernia.  Discussed results with patient.  At this time, she does have leukocytes, pyuria and bacteria and is symptomatic.  Given that she is still symptomatic, opt for treatment.  Patient is agreeable.  Patient with no known drug allergies.  We will plan to culture urine and send patient home on Keflex.  Additionally, question if this lower back pain is related to her urinary complaints versus musculoskeletal pain.  We will plan to treat with muscle relaxers to see if it has any muscle etiology. At this time, patient exhibits no emergent life-threatening condition that require further evaluation in ED or admission. Patient had ample opportunity for questions and discussion. All patient's questions were answered with full understanding. Strict return precautions discussed. Patient expresses understanding and agreement to plan.   Portions of this note were generated with Scientist, clinical (histocompatibility and immunogenetics). Dictation errors may occur despite best attempts at proofreading.  Final Clinical Impression(s) / ED Diagnoses Final diagnoses:  Acute cystitis without hematuria  Acute bilateral low back pain without sciatica    Rx / DC Orders ED Discharge Orders         Ordered    methocarbamol (ROBAXIN) 500 MG tablet  2 times daily     Discontinue  Reprint     09/26/19 1625    cephALEXin (KEFLEX) 500 MG capsule  4 times daily     Discontinue  Reprint     09/26/19 1625           Maxwell Caul,  PA-C 09/26/19 1854    Linwood Dibbles, MD 09/27/19 1000

## 2019-09-26 NOTE — Discharge Instructions (Signed)
Your work-up today was reassuring.  As we discussed, your urine did show some bacteria and given that you are symptomatic, we will plan to treat for UTI.  Additionally, the rest of your CT scan and work-up was reassuring.  As we discussed, the low back pain could be related to the UTI but also could musculoskeletal nature.  We will plan to give you some muscle relaxers.  Take antibiotics as directed. Please take all of your antibiotics until finished.  Take Robaxin as prescribed. This medication will make you drowsy so do not drive or drink alcohol when taking it.  Return the emergency department for any fever, worsening abdominal pain, vomiting, blood in urine, difficulty breathing, chest pain or any other worsening or concerning symptoms.

## 2019-09-28 ENCOUNTER — Encounter: Payer: Self-pay | Admitting: Family Medicine

## 2019-09-28 ENCOUNTER — Other Ambulatory Visit: Payer: Self-pay | Admitting: Family Medicine

## 2019-09-28 LAB — URINE CULTURE: Culture: NO GROWTH

## 2019-09-28 MED ORDER — FLUCONAZOLE 150 MG PO TABS
150.0000 mg | ORAL_TABLET | Freq: Once | ORAL | 0 refills | Status: AC
Start: 2019-09-28 — End: 2019-09-28

## 2019-10-14 ENCOUNTER — Ambulatory Visit: Payer: BC Managed Care – PPO | Admitting: Nurse Practitioner

## 2019-10-21 ENCOUNTER — Encounter: Payer: Self-pay | Admitting: Family Medicine

## 2019-10-21 ENCOUNTER — Telehealth (INDEPENDENT_AMBULATORY_CARE_PROVIDER_SITE_OTHER): Payer: BC Managed Care – PPO | Admitting: Family Medicine

## 2019-10-21 DIAGNOSIS — J069 Acute upper respiratory infection, unspecified: Secondary | ICD-10-CM | POA: Diagnosis not present

## 2019-10-21 MED ORDER — HYDROCOD POLST-CPM POLST ER 10-8 MG/5ML PO SUER: 5.0000 mL | Freq: Two times a day (BID) | ORAL | 0 refills | Status: DC | PRN

## 2019-10-21 MED ORDER — BENZONATATE 100 MG PO CAPS
100.0000 mg | ORAL_CAPSULE | Freq: Two times a day (BID) | ORAL | 0 refills | Status: DC | PRN
Start: 2019-10-21 — End: 2020-08-25

## 2019-10-21 NOTE — Progress Notes (Signed)
Nasal drip cough causes chest pain. Muscle pain from coughing. Started as a sore throat.

## 2019-10-21 NOTE — Assessment & Plan Note (Signed)
Recommend supportive care with increased rest and fluid intake.   Tussionex as needed for cough at night with tessalon during the day.  Call for follow up if symptoms worsening or don't resolve over the next 7-10 days.

## 2019-10-21 NOTE — Progress Notes (Signed)
Natalie Day - 39 y.o. female MRN [id number]  Date of birth: 1981/01/06   This visit type was conducted due to national recommendations for restrictions regarding the COVID-19 Pandemic (e.g. social distancing).  This format is felt to be most appropriate for this patient at this time.  All issues noted in this document were discussed and addressed.  No physical exam was performed (except for noted visual exam findings with Video Visits).  I discussed the limitations of evaluation and management by telemedicine and the availability of in person appointments. The patient expressed understanding and agreed to proceed.  I connected with@ on 10/21/19 at  3:00 PM EDT by a video enabled telemedicine application and verified that I am speaking with the correct person using two identifiers.  Present at visit: Everrett Coombe, DO Alden [facility] Hilgert   Patient Location: Home [address] [place] [address]   Provider location:   [facility]  No chief complaint on file.   HPI  Natalie Day is a 39 y.o. female who presents via audio/video conferencing for a telehealth visit today.  She has complaint of nasal congestion, sore throat and cough from post nasal drainage.  Symptoms started a few days ago.  Her son had similar symptoms last week as well.  She is having trouble sleeping due to cough and has some pain in upper chest from coughing.  She denies fever, chills, headache, shortness of breath, wheezing, or sinus pain.  She has tried theraflu with minor improvement.      ROS:  A comprehensive ROS was completed and negative except as noted per HPI  Past Medical History:  Diagnosis Date  . Abnormal Pap smear 2001   Dysplasia, Colpo/BX, LEEP  . Amenorrhea   . Chlamydia   . HPV (human papilloma virus) infection   . Infection   . STD (sexually transmitted disease)   . Trichimoniasis     Past Surgical History:  Procedure Laterality Date  . CERVICAL BIOPSY  W/ LOOP ELECTRODE  EXCISION  2001  . CESAREAN SECTION    . COLPOSCOPY    . UPPER GASTROINTESTINAL ENDOSCOPY  15 years ago     Family History  Problem Relation Age of Onset  . Healthy Mother   . Healthy Father   . Colon cancer Neg Hx   . Pancreatic cancer Neg Hx   . Esophageal cancer Neg Hx     Social History   Socioeconomic History  . Marital status: Single    Spouse name: Not on file  . Number of children: 1  . Years of education: Masters  . Highest education level: Not on file  Occupational History  . Occupation: [employer]   Tobacco Use  . Smoking status: Never Smoker  . Smokeless tobacco: Never Used  Vaping Use  . Vaping Use: Never used  Substance and Sexual Activity  . Alcohol use: No  . Drug use: No  . Sexual activity: Yes    Partners: Male    Birth control/protection: I.U.D.    Comment: Mirena  Other Topics Concern  . Not on file  Social History Narrative   Lives alone   Caffeine use: soda/tea 3x/day   Right   Handed   Social Determinants of Health   Financial Resource Strain:   . Difficulty of Paying Living Expenses:   Food Insecurity:   . Worried About Programme researcher, broadcasting/film/video in the Last Year:   . [employer] of Food in the Last Year:  Transportation Needs:   . Freight forwarder (Medical):   Marland Kitchen Lack of Transportation (Non-Medical):   Physical Activity:   . Days of Exercise per Week:   . Minutes of Exercise per Session:   Stress:   . Feeling of Stress :   Social Connections:   . Frequency of Communication with Friends and Family:   . Frequency of Social Gatherings with Friends and Family:   . Attends Religious Services:   . Active Member of Clubs or Organizations:   . Attends Banker Meetings:   Marland Kitchen Marital Status:   Intimate Partner Violence:   . Fear of Current or Ex-Partner:   . Emotionally Abused:   Marland Kitchen Physically Abused:   . Sexually Abused:      Current Outpatient Medications:  .  pantoprazole (PROTONIX) 40 MG tablet, Take 1 tablet (40 mg  total) by mouth 2 (two) times daily before a meal., Disp: 180 tablet, Rfl: 5 .  benzonatate (TESSALON) 100 MG capsule, Take 1 capsule (100 mg total) by mouth 2 (two) times daily as needed for cough., Disp: 20 capsule, Rfl: 0 .  chlorpheniramine-HYDROcodone (TUSSIONEX PENNKINETIC ER) 10-8 MG/5ML SUER, Take 5 mLs by mouth every 12 (twelve) hours as needed for cough., Disp: 125 mL, Rfl: 0  EXAM:  VITALS per patient if applicable: Wt 200 lb (90.7 kg)   BMI 31.32 kg/m   GENERAL: alert, oriented, appears well and in no acute distress  HEENT: atraumatic, conjunttiva clear, no obvious abnormalities on inspection of external nose and ears  NECK: normal movements of the head and neck  LUNGS: on inspection no signs of respiratory distress, breathing rate appears normal, no obvious gross SOB, gasping or wheezing  CV: no obvious cyanosis  MS: moves all visible extremities without noticeable abnormality  PSYCH/NEURO: pleasant and cooperative, no obvious depression or anxiety, speech and thought processing grossly intact  ASSESSMENT AND PLAN:  Discussed the following assessment and plan:  URI (upper respiratory infection) Recommend supportive care with increased rest and fluid intake.   Tussionex as needed for cough at night with tessalon during the day.  Call for follow up if symptoms worsening or don't resolve over the next 7-10 days.       I discussed the assessment and treatment plan with the patient. The patient was provided an opportunity to ask questions and all were answered. The patient agreed with the plan and demonstrated an understanding of the instructions.   The patient was advised to call back or seek an in-person evaluation if the symptoms worsen or if the condition fails to improve as anticipated.    Everrett Coombe, DO

## 2019-11-01 IMAGING — DX CHEST - 2 VIEW
2 series · 2 of 2 positions shown · non-contrast
Comparison: 07/23/2010

CLINICAL DATA: Chest pain for 6 weeks.

EXAM:
CHEST - 2 VIEW

[chest pa]
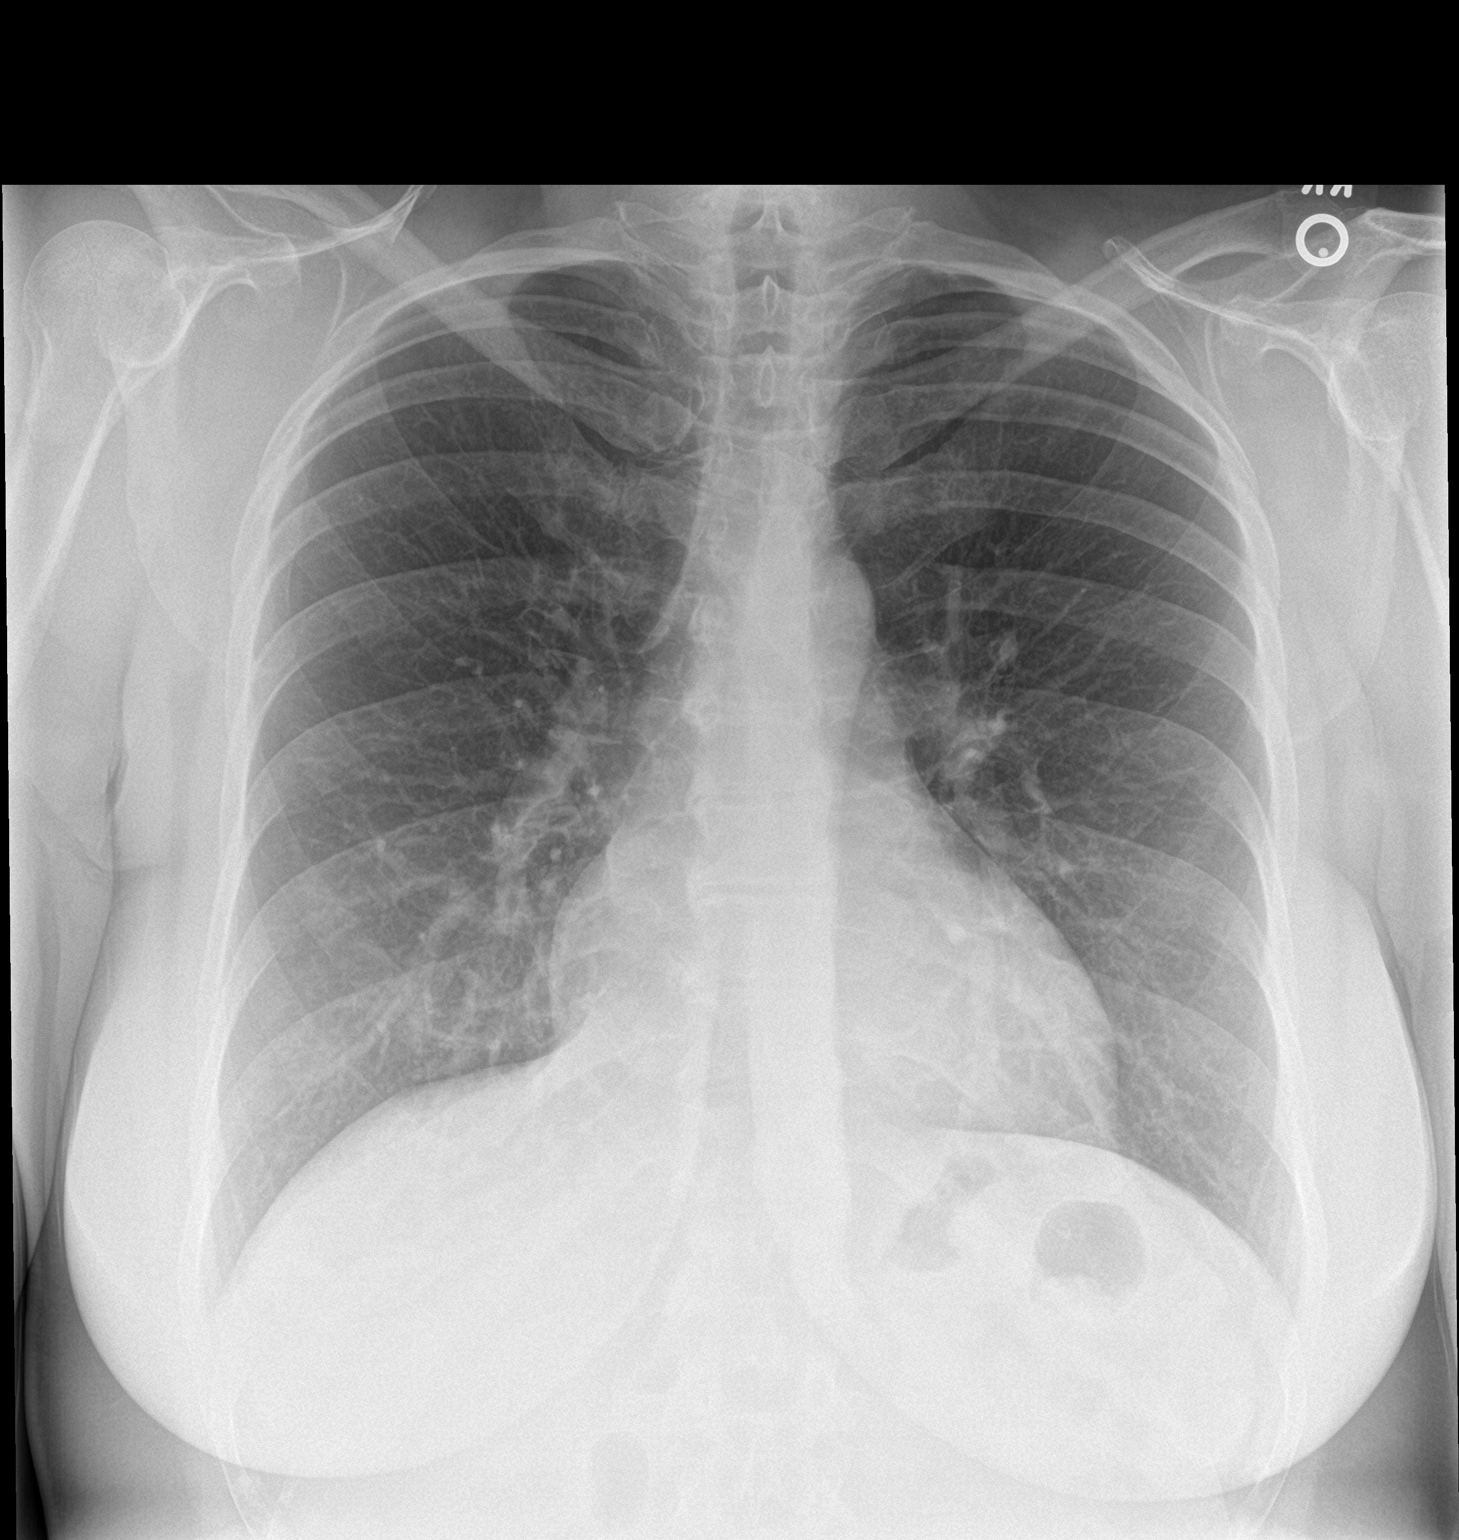

[chest lat]
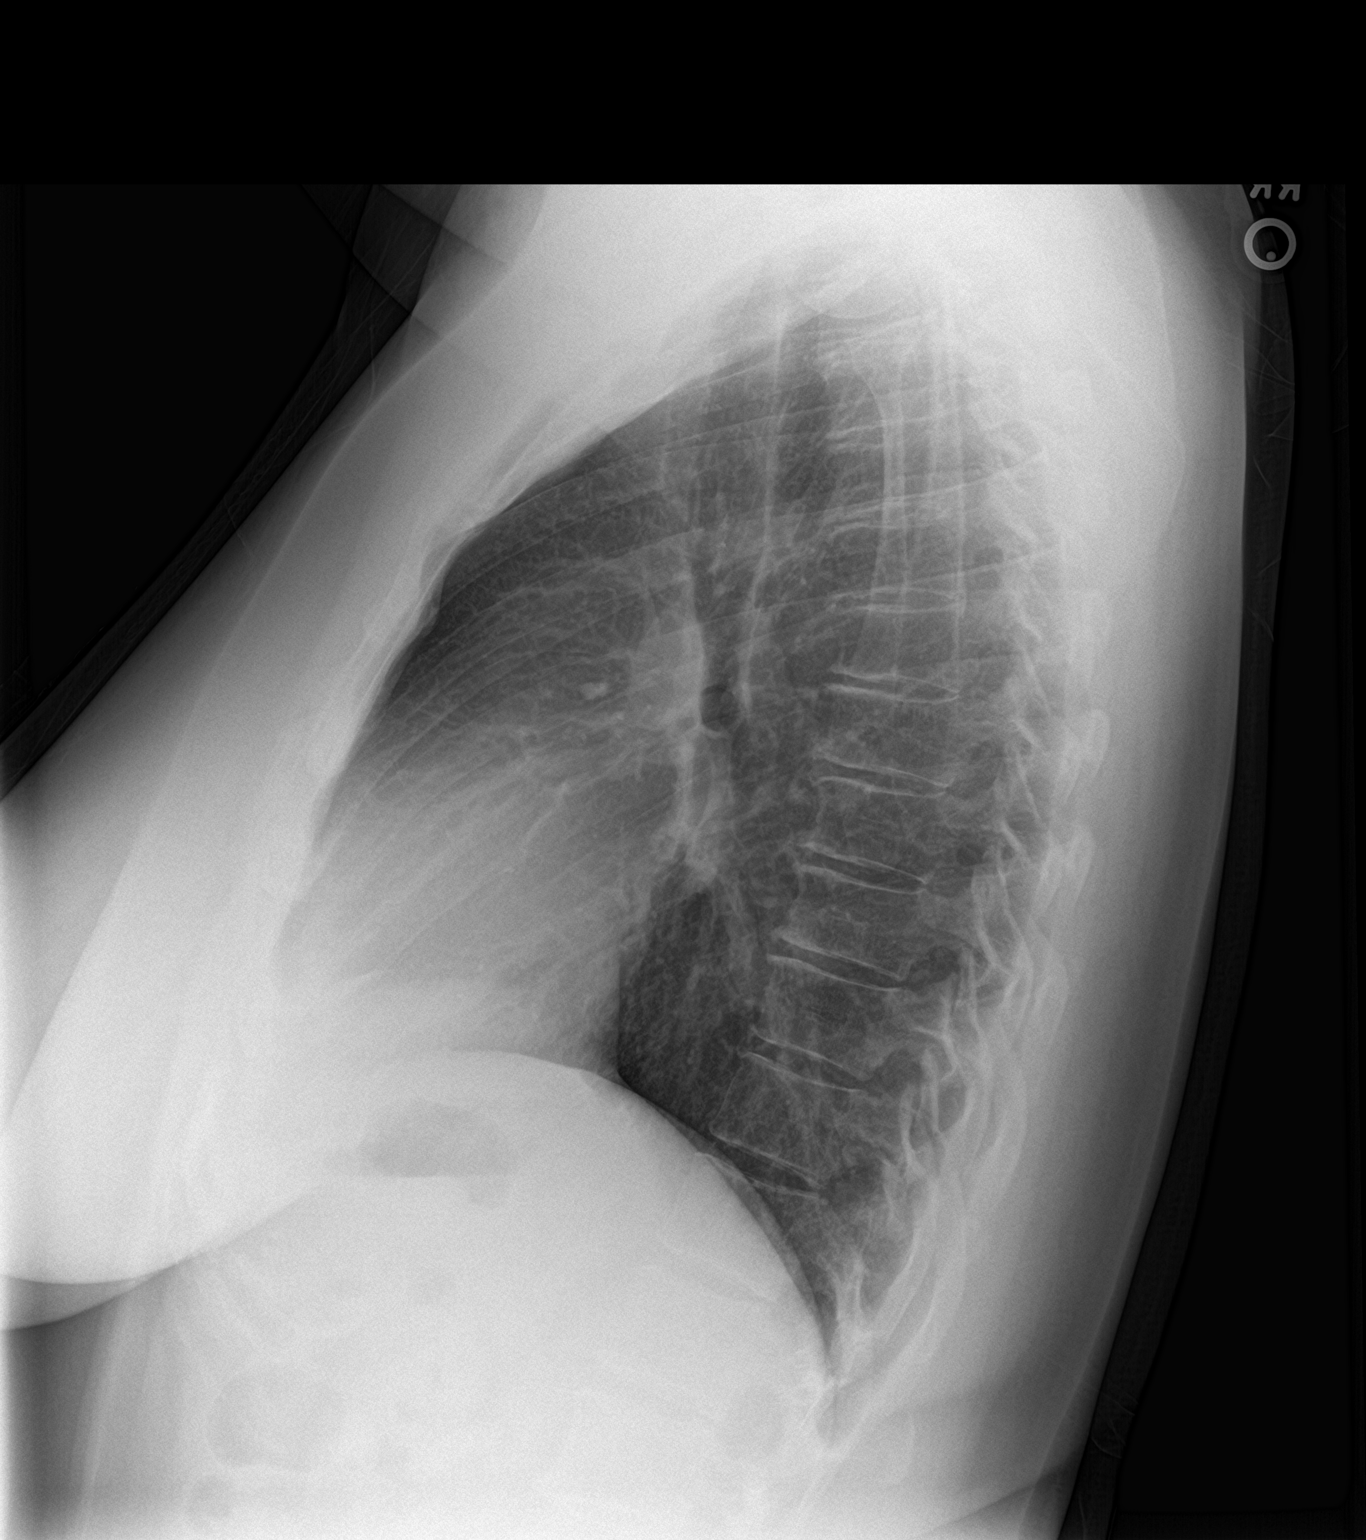

[2 of 2 positions shown; findings below may reference images not displayed]

FINDINGS: The heart size and mediastinal contours are within normal limits.
Both lungs are clear. The visualized skeletal structures are
unremarkable.
IMPRESSION: Negative.  No active cardiopulmonary disease.

## 2019-11-17 ENCOUNTER — Ambulatory Visit: Payer: BC Managed Care – PPO | Admitting: Nurse Practitioner

## 2019-11-17 ENCOUNTER — Encounter: Payer: Self-pay | Admitting: Nurse Practitioner

## 2019-11-17 VITALS — BP 104/80 | HR 76 | Ht 66.25 in | Wt 216.0 lb

## 2019-11-17 DIAGNOSIS — R1319 Other dysphagia: Secondary | ICD-10-CM

## 2019-11-17 DIAGNOSIS — K219 Gastro-esophageal reflux disease without esophagitis: Secondary | ICD-10-CM

## 2019-11-17 MED ORDER — FAMOTIDINE 20 MG PO TABS: 20.0000 mg | ORAL_TABLET | Freq: Every day | ORAL | 2 refills | Status: DC

## 2019-11-17 NOTE — Patient Instructions (Addendum)
You have been scheduled for a Barium Esophogram at [facility] (1st floor of the hospital) on 11/26/19 at 11:00am. Please arrive 15 minutes prior to your appointment for registration. Make certain not to have anything to eat or drink 3 hours prior to your test. If you need to reschedule for any reason, please contact radiology at [phone] to do so. __________________________________________________________________ A barium swallow is an examination that concentrates on views of the esophagus. This tends to be a double contrast exam (barium and two liquids which, when combined, create a gas to distend the wall of the oesophagus) or single contrast (non-ionic iodine based). The study is usually tailored to your symptoms so a good history is essential. Attention is paid during the study to the form, structure and configuration of the esophagus, looking for functional disorders (such as aspiration, dysphagia, achalasia, motility and reflux) EXAMINATION You may be asked to change into a gown, depending on the type of swallow being performed. A radiologist and radiographer will perform the procedure. The radiologist will advise you of the type of contrast selected for your procedure and direct you during the exam. You will be asked to stand, sit or lie in several different positions and to hold a small amount of fluid in your mouth before being asked to swallow while the imaging is performed .In some instances you may be asked to swallow barium coated marshmallows to assess the motility of a solid food bolus. The exam can be recorded as a digital or video fluoroscopy procedure. POST PROCEDURE It will take 1-2 days for the barium to pass through your system. To facilitate this, it is important, unless otherwise directed, to increase your fluids for the next 24-48hrs and to resume your normal diet.  This test typically takes about 30 minutes to perform.  Decrease your Pepcid to once daily.   Please  see Genella Rife handout provided today.   Please also follow a low carb diet.   Due to recent changes in healthcare laws, you may see the results of your imaging and laboratory studies on MyChart before your provider has had a chance to review them.  We understand that in some cases there may be results that are confusing or concerning to you. Not all laboratory results come back in the same time frame and the provider may be waiting for multiple results in order to interpret others.  Please give [place] 48 hours in order for your provider to thoroughly review all the results before contacting the office for clarification of your results.   Thank you for choosing me and [facility].  Willette Cluster NP

## 2019-11-17 NOTE — Progress Notes (Signed)
Agree with assessment and plan as outlined.  

## 2019-11-17 NOTE — Progress Notes (Signed)
IMPRESSION and PLAN   # GERD with pyrosis --Breakthrough symptoms on twice daily PPI, improved after addition of famotidine 20 mg twice daily.  --We discussed lifestyle changes / anti-reflux measures such as reduction of caffeine consumption, weight loss, going to bed on an empty stomach.  I think she is on an excessive amount of acid blockers at this point.   --She will implement measures for lifestyle changes.  We will continue twice daily Protonix for now.  I have asked her to reduce famotidine to just 20 mg daily if needed for breakthrough symptoms.  Hopefully with lifestyle changes/weight loss she will be able to further reduce the amount of antireflux medications at some point -- low carbohydrate diet.  Gradual weight loss can occur by reducing caloric intake by just 500 cal a day --Follow-up with me in 4 to 6 weeks  # intermittent dysphagia --Most often occurs with consumption of thick, milky items such as smoothies.  No findings on EGD in 2019 (done for dysphagia and GERD) to explain symptoms.  --Obtain a barium swallow with tablet.  Perhaps she has esophageal dysmotility  # "Throat' spasms --Gets an episode once every 1-2 months over the last 3 to 4 years. --Does not sound like esophageal spasms.  She has no associated chest pain.  Episodes occur in the absence of PO intake and are associated with transient inability to breath. Patient feels like there is a correlation between these episodes and stress/anxiety. --Await barium swallow results. Consider esophageal manometry but again, symptoms don't sound esophageal related.  --It may be worth treating for anxiety to see if episodes resolve --If no improvement consider ENT evaluation     HISTORY OF PRESENT ILLNESS    Primary GI: Natalie Patrick, MD  Chief complaint : throat spasms  This patient is a 39 year old female with PMH significant for GERD, obesity.  She was seen here in September 2018 for evaluation of GERD  symptoms including pyrosis and globus sensation as well is occasional solid food dysphagia.  In January 2019 she underwent EGD with findings of a few gastric polyps and a 1 cm hiatal hernia.  Random esophageal biopsies were done to rule out eosinophilic esophagitis.  Biopsies of the esophagus were negative.  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  She says these episodes are intermittent and have been going on for 3 to 4 years.  She describes the episodes of feeling a tightness in her throat which is associated with an inability to breathe for several seconds.  These episodes do not occur with eating, in fact her last episode in June occurred while driving.  She does feel like episodes could be related to stress/anxiety.  Sounds like patient discussed stress /anxiety with PCP.  Patient was not interested in being treated with medication at the time . She has had problems with sinus drainage in the past but not currently.  Again the throat spasm episodes are not related to eating however patient still continues to have occasional dysphagia (seen in 2019 for this).  Problem swallowing occurs mainly with thick, milky drinks such as smoothies.  Drinking a lot of water seems to help.   Patient has a history of GERD, she was taking twice daily PPI but at some point that was decreased to once daily.  In June she saw PCP for GERD symptoms as well as the throat spasms.  Her PPI  was increased to twice daily and famotidine 20 mg twice daily was added to the regimen.  Patient says she has not had an episode of throat spasms since medication changes in late June however it could be a coincidence as the episodes are intermittent anyway.  She does feel like her breakthrough GERD symptoms have improved since the addition of Pepcid and the increase in pantoprazole to twice daily. Patient consumes a lot of caffeine, she is a big tea drinker.  She does not always  go to bed on an empty stomach.  Patient says she would like to lose some weight.   Data Reviewed:  July 2021 Platelets 125, hemoglobin 12.8, WBC 7.4,  CMP was okay Noncontrast CT scan of the abdomen and pelvis remarkable for steatosis  June 2021 TSH normal   PREVIOUS ENDOSCOPIC EVALUATIONS / GI studies:  Jan 2019 EGD for GERD symptoms and dysphagia -Esophagogastric landmarks identified. - 1 cm hiatal hernia. - Normal remainder of examined esophagus - biopsies taken to rule out eosinophilic esophagitis. - A few benign appearing, suspect fundic gland polyps, largest was resected and retrieved. - Normal stomach otherwise. - Normal duodenal bulb and second portion of the duodenum.   Review of systems:     No chest pain, no SOB, no fevers, no urinary sx   Past Medical History:  Diagnosis Date  . Abnormal Pap smear 2001   Dysplasia, Colpo/BX, LEEP  . Amenorrhea   . Chlamydia   . HPV (human papilloma virus) infection   . Infection   . STD (sexually transmitted disease)   . Trichimoniasis     Patient's surgical history, family medical history, social history, medications and allergies were all reviewed in Epic   Creatinine clearance cannot be calculated (Patient's most recent lab result is older than the maximum 21 days allowed.)  Current Outpatient Medications  Medication Sig Dispense Refill  . benzonatate (TESSALON) 100 MG capsule Take 1 capsule (100 mg total) by mouth 2 (two) times daily as needed for cough. 20 capsule 0  . chlorpheniramine-HYDROcodone (TUSSIONEX PENNKINETIC ER) 10-8 MG/5ML SUER Take 5 mLs by mouth every 12 (twelve) hours as needed for cough. 125 mL 0  . pantoprazole (PROTONIX) 40 MG tablet Take 1 tablet (40 mg total) by mouth 2 (two) times daily before a meal. 180 tablet 5   No current facility-administered medications for this visit.    Filed Weights   11/17/19 0856  Weight: 216 lb (98 kg)   PHYSICAL EXAM    Ht 5' 6.25" (1.683 m) Comment: height  measured without shoes  Wt 216 lb (98 kg)   BMI 34.60 kg/m   GENERAL:  Pleasant female in NAD PSYCH: : Cooperative, normal affect CARDIAC:  RRR PULM: Normal respiratory effort, lungs CTA bilaterally, no wheezing ABDOMEN:  Nondistended, soft, nontender. No obvious masses, no hepatomegaly,  normal bowel sounds SKIN:  turgor, no lesions seen Musculoskeletal:  Normal muscle tone, normal strength NEURO: Alert and oriented x 3, no focal neurologic deficits   Willette Cluster , NP 11/17/2019, 8:59 AM

## 2019-11-26 ENCOUNTER — Ambulatory Visit (HOSPITAL_COMMUNITY): Payer: BC Managed Care – PPO

## 2019-12-01 ENCOUNTER — Encounter: Payer: Self-pay | Admitting: Family Medicine

## 2019-12-02 NOTE — Telephone Encounter (Signed)
Please schedule her for a visit to discuss.

## 2019-12-07 ENCOUNTER — Telehealth (INDEPENDENT_AMBULATORY_CARE_PROVIDER_SITE_OTHER): Payer: BC Managed Care – PPO | Admitting: Family Medicine

## 2019-12-07 ENCOUNTER — Encounter: Payer: Self-pay | Admitting: Family Medicine

## 2019-12-07 DIAGNOSIS — F411 Generalized anxiety disorder: Secondary | ICD-10-CM | POA: Insufficient documentation

## 2019-12-07 MED ORDER — HYDROXYZINE HCL 25 MG PO TABS: 25.0000 mg | ORAL_TABLET | Freq: Three times a day (TID) | ORAL | 1 refills | Status: DC | PRN

## 2019-12-07 MED ORDER — ESCITALOPRAM OXALATE 10 MG PO TABS: 10.0000 mg | ORAL_TABLET | Freq: Every day | ORAL | 3 refills | Status: DC

## 2019-12-07 NOTE — Assessment & Plan Note (Signed)
Her symptoms related to anxiety are affecting her quality of life.   We discussed options for management of her anxiety including medication and counseling.  She will look into EAP program through her employer. Will start lexapro daily with vistaril as needed. Discussed possible side effects related to each of these medications.  I will plan to follow up with her in 3-4 weeks.

## 2019-12-07 NOTE — Progress Notes (Signed)
Having several anxious days.

## 2019-12-07 NOTE — Progress Notes (Signed)
Natalie Day - 39 y.o. female MRN [id number]  Date of birth: 12-05-1980   This visit type was conducted due to national recommendations for restrictions regarding the COVID-19 Pandemic (e.g. social distancing).  This format is felt to be most appropriate for this patient at this time.  All issues noted in this document were discussed and addressed.  No physical exam was performed (except for noted visual exam findings with Video Visits).  I discussed the limitations of evaluation and management by telemedicine and the availability of in person appointments. The patient expressed understanding and agreed to proceed.  I connected with@ on 12/07/19 at  8:50 AM EDT by a video enabled telemedicine application and verified that I am speaking with the correct person using two identifiers.  Present at visit: Everrett Coombe, DO Ald[facility] Duffin   Patient Location: Home [address] [place] [address]   Provider location:   [facility]  Chief Complaint  Patient presents with  . Anxiety    HPI  Natalie Day is a 39 y.o. female who presents via audio/video conferencing for a telehealth visit today.  She has complaint today of increased anxiety.  She reports that she worries a lot throughout the day.  She cites stressors related to work and being a single parent.  She feels particularly anxious while driving as well.  She has seen a therapist in the past to help with her anxiety and is considering seeing counselor through EAP at work.  She has never used medication to help with her anxiety but is open to trying this.    Depression screen [facility] 2/9 12/07/2019 09/08/2019 07/24/2018  Decreased Interest 2 1 0  Down, Depressed, Hopeless 1 2 0  PHQ - 2 Score 3 3 0  Altered sleeping 0 3 -  Tired, decreased energy 1 2 -  Change in appetite 1 1 -  Feeling bad or failure about yourself  1 2 -  Trouble concentrating 1 0 -  Moving slowly or fidgety/restless 0 0 -  Suicidal thoughts 1 0 -  PHQ-9  Score 8 11 -  Difficult doing work/chores Not difficult at all Somewhat difficult -   GAD 7 : Generalized Anxiety Score 12/07/2019 09/08/2019  Nervous, Anxious, on Edge 1 2  Control/stop worrying 2 2  Worry too much - different things 2 2  Trouble relaxing 0 2  Restless 0 0  Easily annoyed or irritable 2 2  Afraid - awful might happen 1 2  Total GAD 7 Score 8 12  Anxiety Difficulty Somewhat difficult Somewhat difficult       ROS:  A comprehensive ROS was completed and negative except as noted per HPI  Past Medical History:  Diagnosis Date  . Abnormal Pap smear 2001   Dysplasia, Colpo/BX, LEEP  . Amenorrhea   . Chlamydia   . GERD (gastroesophageal reflux disease)   . HPV (human papilloma virus) infection   . Infection   . STD (sexually transmitted disease)   . Trichimoniasis     Past Surgical History:  Procedure Laterality Date  . CERVICAL BIOPSY  W/ LOOP ELECTRODE EXCISION  2001  . CESAREAN SECTION    . COLPOSCOPY    . UPPER GASTROINTESTINAL ENDOSCOPY  15 years ago     Family History  Problem Relation Age of Onset  . Healthy Mother   . Healthy Father   . Colon cancer Neg Hx   . Pancreatic cancer Neg Hx   . Esophageal cancer Neg Hx  Social History   Socioeconomic History  . Marital status: Single    Spouse name: Not on file  . Number of children: 1  . Years of education: Masters  . Highest education level: Not on file  Occupational History  . Occupation: [employer]   Tobacco Use  . Smoking status: Never Smoker  . Smokeless tobacco: Never Used  Vaping Use  . Vaping Use: Never used  Substance and Sexual Activity  . Alcohol use: No  . Drug use: No  . Sexual activity: Yes    Partners: Male    Birth control/protection: I.U.D.    Comment: Mirena  Other Topics Concern  . Not on file  Social History Narrative   Lives alone   Caffeine use: soda/tea 3x/day   Right   Handed   Social Determinants of Health   Financial Resource Strain:   .  Difficulty of Paying Living Expenses: Not on file  Food Insecurity:   . Worried About Programme researcher, broadcasting/film/video in the Last Year: Not on file  . Ran Out of Food in the Last Year: Not on file  Transportation Needs:   . Lack of Transportation (Medical): Not on file  . Lack of Transportation (Non-Medical): Not on file  Physical Activity:   . Days of Exercise per Week: Not on file  . Minutes of Exercise per Session: Not on file  Stress:   . Feeling of Stress : Not on file  Social Connections:   . Frequency of Communication with Friends and Family: Not on file  . Frequency of Social Gatherings with Friends and Family: Not on file  . Attends Religious Services: Not on file  . Active Member of Clubs or Organizations: Not on file  . Attends Banker Meetings: Not on file  . Marital Status: Not on file  Intimate Partner Violence:   . Fear of Current or Ex-Partner: Not on file  . Emotionally Abused: Not on file  . Physically Abused: Not on file  . Sexually Abused: Not on file     Current Outpatient Medications:  .  benzonatate (TESSALON) 100 MG capsule, Take 1 capsule (100 mg total) by mouth 2 (two) times daily as needed for cough., Disp: 20 capsule, Rfl: 0 .  chlorpheniramine-HYDROcodone (TUSSIONEX PENNKINETIC ER) 10-8 MG/5ML SUER, Take 5 mLs by mouth every 12 (twelve) hours as needed for cough., Disp: 125 mL, Rfl: 0 .  famotidine (PEPCID) 20 MG tablet, Take 1 tablet (20 mg total) by mouth daily., Disp: 30 tablet, Rfl: 2 .  pantoprazole (PROTONIX) 40 MG tablet, Take 1 tablet (40 mg total) by mouth 2 (two) times daily before a meal., Disp: 180 tablet, Rfl: 5 .  escitalopram (LEXAPRO) 10 MG tablet, Take 1 tablet (10 mg total) by mouth daily. Take 5mg  daily x1 week then increase to 10mg  daily., Disp: 30 tablet, Rfl: 3 .  hydrOXYzine (ATARAX/VISTARIL) 25 MG tablet, Take 1 tablet (25 mg total) by mouth 3 (three) times daily as needed for anxiety., Disp: 45 tablet, Rfl: 1  EXAM:  VITALS  per patient if applicable: Wt 210 lb (95.3 kg)   BMI 33.64 kg/m   GENERAL: alert, oriented, appears well and in no acute distress  HEENT: atraumatic, conjunttiva clear, no obvious abnormalities on inspection of external nose and ears  NECK: normal movements of the head and neck  LUNGS: on inspection no signs of respiratory distress, breathing rate appears normal, no obvious gross SOB, gasping or wheezing  CV: no  obvious cyanosis  MS: moves all visible extremities without noticeable abnormality  PSYCH/NEURO: pleasant and cooperative, no obvious depression or anxiety, speech and thought processing grossly intact  ASSESSMENT AND PLAN:  Discussed the following assessment and plan:  GAD (generalized anxiety disorder) Her symptoms related to anxiety are affecting her quality of life.   We discussed options for management of her anxiety including medication and counseling.  She will look into EAP program through her employer. Will start lexapro daily with vistaril as needed. Discussed possible side effects related to each of these medications.  I will plan to follow up with her in 3-4 weeks.      I discussed the assessment and treatment plan with the patient. The patient was provided an opportunity to ask questions and all were answered. The patient agreed with the plan and demonstrated an understanding of the instructions.   The patient was advised to call back or seek an in-person evaluation if the symptoms worsen or if the condition fails to improve as anticipated.    Everrett Coombe, DO

## 2019-12-08 ENCOUNTER — Ambulatory Visit (HOSPITAL_COMMUNITY)
Admission: RE | Admit: 2019-12-08 | Discharge: 2019-12-08 | Disposition: A | Discharge status: Home / Self Care | Payer: BC Managed Care – PPO | Source: Ambulatory Visit | Attending: Nurse Practitioner | Admitting: Nurse Practitioner

## 2019-12-08 ENCOUNTER — Other Ambulatory Visit: Payer: Self-pay

## 2019-12-08 DIAGNOSIS — K219 Gastro-esophageal reflux disease without esophagitis: Secondary | ICD-10-CM | POA: Diagnosis present

## 2019-12-22 ENCOUNTER — Ambulatory Visit: Payer: BC Managed Care – PPO | Admitting: Nurse Practitioner

## 2020-01-22 ENCOUNTER — Other Ambulatory Visit: Payer: Self-pay | Admitting: Physician Assistant

## 2020-02-16 ENCOUNTER — Ambulatory Visit: Payer: BC Managed Care – PPO | Attending: Family

## 2020-02-16 DIAGNOSIS — Z23 Encounter for immunization: Secondary | ICD-10-CM

## 2020-03-13 ENCOUNTER — Encounter: Payer: Self-pay | Admitting: Osteopathic Medicine

## 2020-03-13 ENCOUNTER — Ambulatory Visit (INDEPENDENT_AMBULATORY_CARE_PROVIDER_SITE_OTHER): Payer: BC Managed Care – PPO | Admitting: Osteopathic Medicine

## 2020-03-13 VITALS — BP 124/86 | HR 61 | Temp 98.3°F | Wt 208.3 lb

## 2020-03-13 DIAGNOSIS — R109 Unspecified abdominal pain: Secondary | ICD-10-CM | POA: Diagnosis not present

## 2020-03-13 DIAGNOSIS — Z23 Encounter for immunization: Secondary | ICD-10-CM | POA: Diagnosis not present

## 2020-03-13 DIAGNOSIS — M546 Pain in thoracic spine: Secondary | ICD-10-CM

## 2020-03-13 DIAGNOSIS — G8929 Other chronic pain: Secondary | ICD-10-CM | POA: Diagnosis not present

## 2020-03-13 LAB — POCT URINALYSIS DIP (CLINITEK)
Bilirubin, UA: NEGATIVE
Blood, UA: NEGATIVE
Glucose, UA: NEGATIVE mg/dL
Leukocytes, UA: NEGATIVE
Nitrite, UA: NEGATIVE
POC PROTEIN,UA: NEGATIVE
Spec Grav, UA: >=1.03 — AB (ref 1.010–1.025)
Urobilinogen, UA: 0.2 E.U./dL
pH, UA: 5.5 (ref 5.0–8.0)

## 2020-03-13 MED ORDER — CYCLOBENZAPRINE HCL 10 MG PO TABS: 5.0000 mg | ORAL_TABLET | Freq: Three times a day (TID) | ORAL | 1 refills | Status: DC | PRN

## 2020-03-13 MED ORDER — MELOXICAM 15 MG PO TABS
15.0000 mg | ORAL_TABLET | Freq: Every day | ORAL | 0 refills | Status: DC
Start: 2020-03-13 — End: 2020-10-06

## 2020-03-13 NOTE — Patient Instructions (Addendum)
Plan: See printed instructions for home exercises / stretches Prescriptions:  Anti-inflammatory (meloxicam) take daily for 5-7 days then can keep on hand for use as-needed after that  Muscle relaxer (cyclobenzaprine) as needed for muscle spasm - may help to take in the evening / bedtime  Refer for formal physical therapy Please contact our office for sports medicine appointment with Dr T if PT not helping      Thoracic Strain Rehab Ask your health care provider which exercises are safe for you. Do exercises exactly as told by your health care provider and adjust them as directed. It is normal to feel mild stretching, pulling, tightness, or discomfort as you do these exercises. Stop right away if you feel sudden pain or your pain gets worse. Do not begin these exercises until told by your health care provider. Stretching and range-of-motion exercise This exercise warms up your muscles and joints and improves the movement and flexibility of your back and shoulders. This exercise also helps to relieve pain. Chest and spine stretch  1. Lie down on your back on a firm surface. 2. Roll a towel or a small blanket so it is about 4 inches (10 cm) in diameter. 3. Put the towel lengthwise under the middle of your back so it is under your spine, but not under your shoulder blades. 4. Put your hands behind your head and let your elbows fall to your sides. This will increase your stretch. 5. Take a deep breath (inhale). 6. Hold for __________ seconds. 7. Relax after you breathe out (exhale). Repeat __________ times. Complete this exercise __________ times a day. Strengthening exercises These exercises build strength and endurance in your back and your shoulder blade muscles. Endurance is the ability to use your muscles for a long time, even after they get tired. Alternating arm and leg raises  1. Get on your hands and knees on a firm surface. If you are on a hard floor, you may want to use padding,  such as an exercise mat, to cushion your knees. 2. Line up your arms and legs. Your hands should be directly below your shoulders, and your knees should be directly below your hips. 3. Lift your left leg behind you. At the same time, raise your right arm and straighten it in front of you. ? Do not lift your leg higher than your hip. ? Do not lift your arm higher than your shoulder. ? Keep your abdominal and back muscles tight. ? Keep your hips facing the ground. ? Do not arch your back. ? Keep your balance carefully, and do not hold your breath. 4. Hold for __________ seconds. 5. Slowly return to the starting position and repeat with your right leg and your left arm. Repeat __________ times. Complete this exercise __________ times a day. Straight arm rows This exercise is also called shoulder extension exercise. 1. Stand with your feet shoulder width apart. 2. Secure an exercise band to a stable object in front of you so the band is at or above shoulder height. 3. Hold one end of the exercise band in each hand. 4. Straighten your elbows and lift your hands up to shoulder height. 5. Step back, away from the secured end of the exercise band, until the band stretches. 6. Squeeze your shoulder blades together and pull your hands down to the sides of your thighs. Stop when your hands are straight down by your sides. This is shoulder extension. Do not let your hands go behind your body.  7. Hold for __________ seconds. 8. Slowly return to the starting position. Repeat __________ times. Complete this exercise __________ times a day. Prone shoulder external rotation 1. Lie on your abdomen on a firm bed so your left / right forearm hangs over the edge of the bed and your upper arm is on the bed, straight out from your body. This is the prone position. ? Your elbow should be bent. ? Your palm should be facing your feet. 2. If instructed, hold a __________ weight in your hand. 3. Squeeze your  shoulder blade toward the middle of your back. Do not let your shoulder lift toward your ear. 4. Keep your elbow bent in a 90-degree angle (right angle) while you slowly move your forearm up toward the ceiling. Move your forearm up to the height of the bed, toward your head. This is external rotation. ? Your upper arm should not move. ? At the top of the movement, your palm should face the floor. 5. Hold for __________ seconds. 6. Slowly return to the starting position and relax your muscles. Repeat __________ times. Complete this exercise __________ times a day. Rowing scapular retraction This is an exercise in which the shoulder blades (scapulae) are pulled toward each other (retraction). 1. Sit in a stable chair without armrests, or stand up. 2. Secure an exercise band to a stable object in front of you so the band is at shoulder height. 3. Hold one end of the exercise band in each hand. Your palms should face down. 4. Bring your arms out straight in front of you. 5. Step back, away from the secured end of the exercise band, until the band stretches. 6. Pull the band backward. As you do this, bend your elbows and squeeze your shoulder blades together, but avoid letting the rest of your body move. Do not shrug your shoulders upward while you do this. 7. Stop when your elbows are at your sides or slightly behind your body. 8. Hold for __________ seconds. 9. Slowly straighten your arms to return to the starting position. Repeat __________ times. Complete this exercise __________ times a day. Posture and body mechanics Good posture and healthy body mechanics can help to relieve stress in your body's tissues and joints. Body mechanics refers to the movements and positions of your body while you do your daily activities. Posture is part of body mechanics. Good posture means:  Your spine is in its natural S-curve position (neutral).  Your shoulders are pulled back slightly.  Your head is not  tipped forward. Follow these guidelines to improve your posture and body mechanics in your everyday activities. Standing   When standing, keep your spine neutral and your feet about hip width apart. Keep a slight bend in your knees. Your ears, shoulders, and hips should line up with each other.  When you do a task in which you lean forward while standing in one place for a long time, place one foot up on a stable object that is 2-4 inches (5-10 cm) high, such as a footstool. This helps keep your spine neutral. Sitting   When sitting, keep your spine neutral and keep your feet flat on the floor. Use a footrest, if necessary, and keep your thighs parallel to the floor. Avoid rounding your shoulders, and avoid tilting your head forward.  When working at a desk or a computer, keep your desk at a height where your hands are slightly lower than your elbows. Slide your chair under your desk so  you are close enough to maintain good posture.  When working at a computer, place your monitor at a height where you are looking straight ahead and you do not have to tilt your head forward or downward to look at the screen. Resting When lying down and resting, avoid positions that are most painful for you.  If you have pain with activities such as sitting, bending, stooping, or squatting (flexion-basedactivities), lie in a position in which your body does not bend very much. For example, avoid curling up on your side with your arms and knees near your chest (fetal position).  If you have pain with activities such as standing for a long time or reaching with your arms (extension-basedactivities), lie with your spine in a neutral position and bend your knees slightly. Try the following positions: ? Lie on your side with a pillow between your knees. ? Lie on your back with a pillow under your knees.  Lifting   When lifting objects, keep your feet at least shoulder width apart and tighten your abdominal  muscles.  Bend your knees and hips and keep your spine neutral. It is important to lift using the strength of your legs, not your back. Do not lock your knees straight out.  Always ask for help to lift heavy or awkward objects. This information is not intended to replace advice given to you by your health care provider. Make sure you discuss any questions you have with your health care provider. Document Revised: 06/26/2018 Document Reviewed: 04/13/2018 Elsevier Patient Education  2020 [employer].

## 2020-03-13 NOTE — Progress Notes (Signed)
Natalie Day is a 39 y.o. female who presents to  [facility] at [facility]  today, 03/13/20, seeking care for the following:  . Back pain, has been ongoing a few weeks, improved now but not resolved. Located amid-thoracic area, described as spasm that comes and goes. On exam, tenderness to palpation lateral to mid-thoracic / rhomboid musculature.  . pt is mildly concerned about relation of back pain to UTI (she has upcoming appt w/ urology, reports recently treated by her OBGYN for UTI, last UCx on file in our system 09/2019 no growth).  . Reviewed CT results renal stone study 09/26/19 and no significant MSK findings.      ASSESSMENT & PLAN with other pertinent findings:  The primary encounter diagnosis was Chronic bilateral thoracic back pain. Diagnoses of Flank pain and Need for influenza vaccination were also pertinent to this visit.   Results for orders placed or performed in visit on 03/13/20 (from the past 24 hour(s))  POCT URINALYSIS DIP (CLINITEK)     Status: Abnormal   Collection Time: 03/13/20 12:02 PM  Result Value Ref Range   Color, UA other (A) yellow   Clarity, UA clear clear   Glucose, UA negative negative mg/dL   Bilirubin, UA negative negative   Ketones, POC UA trace (5) (A) negative mg/dL   Spec Grav, UA >=1.638 (A) 1.010 - 1.025   Blood, UA negative negative   pH, UA 5.5 5.0 - 8.0   POC PROTEIN,UA negative negative, trace   Urobilinogen, UA 0.2 0.2 or 1.0 E.U./dL   Nitrite, UA Negative Negative   Leukocytes, UA Negative Negative    Orders Placed This Encounter  Procedures  . Urine Culture  . Flu Vaccine QUAD 6+ mos PF IM (Fluarix Quad PF)  . Urinalysis, Routine w reflex microscopic  . POCT URINALYSIS DIP (CLINITEK)      Patient Instructions  Plan: See printed instructions for home exercises / stretches Prescriptions:  Anti-inflammatory (meloxicam) take daily for 5-7 days then can keep on hand for use  as-needed after that  Muscle relaxer (cyclobenzaprine) as needed for muscle spasm - may help to take in the evening / bedtime  Refer for formal physical therapy Please contact our office for sports medicine appointment with Dr T if PT not helping      Thoracic Strain Rehab Ask your health care provider which exercises are safe for you. Do exercises exactly as told by your health care provider and adjust them as directed. It is normal to feel mild stretching, pulling, tightness, or discomfort as you do these exercises. Stop right away if you feel sudden pain or your pain gets worse. Do not begin these exercises until told by your health care provider. Stretching and range-of-motion exercise This exercise warms up your muscles and joints and improves the movement and flexibility of your back and shoulders. This exercise also helps to relieve pain. Chest and spine stretch  1. Lie down on your back on a firm surface. 2. Roll a towel or a small blanket so it is about 4 inches (10 cm) in diameter. 3. Put the towel lengthwise under the middle of your back so it is under your spine, but not under your shoulder blades. 4. Put your hands behind your head and let your elbows fall to your sides. This will increase your stretch. 5. Take a deep breath (inhale). 6. Hold for __________ seconds. 7. Relax after you breathe out (exhale). Repeat __________ times. Complete  this exercise __________ times a day. Strengthening exercises These exercises build strength and endurance in your back and your shoulder blade muscles. Endurance is the ability to use your muscles for a long time, even after they get tired. Alternating arm and leg raises  1. Get on your hands and knees on a firm surface. If you are on a hard floor, you may want to use padding, such as an exercise mat, to cushion your knees. 2. Line up your arms and legs. Your hands should be directly below your shoulders, and your knees should be directly  below your hips. 3. Lift your left leg behind you. At the same time, raise your right arm and straighten it in front of you. ? Do not lift your leg higher than your hip. ? Do not lift your arm higher than your shoulder. ? Keep your abdominal and back muscles tight. ? Keep your hips facing the ground. ? Do not arch your back. ? Keep your balance carefully, and do not hold your breath. 4. Hold for __________ seconds. 5. Slowly return to the starting position and repeat with your right leg and your left arm. Repeat __________ times. Complete this exercise __________ times a day. Straight arm rows This exercise is also called shoulder extension exercise. 1. Stand with your feet shoulder width apart. 2. Secure an exercise band to a stable object in front of you so the band is at or above shoulder height. 3. Hold one end of the exercise band in each hand. 4. Straighten your elbows and lift your hands up to shoulder height. 5. Step back, away from the secured end of the exercise band, until the band stretches. 6. Squeeze your shoulder blades together and pull your hands down to the sides of your thighs. Stop when your hands are straight down by your sides. This is shoulder extension. Do not let your hands go behind your body. 7. Hold for __________ seconds. 8. Slowly return to the starting position. Repeat __________ times. Complete this exercise __________ times a day. Prone shoulder external rotation 1. Lie on your abdomen on a firm bed so your left / right forearm hangs over the edge of the bed and your upper arm is on the bed, straight out from your body. This is the prone position. ? Your elbow should be bent. ? Your palm should be facing your feet. 2. If instructed, hold a __________ weight in your hand. 3. Squeeze your shoulder blade toward the middle of your back. Do not let your shoulder lift toward your ear. 4. Keep your elbow bent in a 90-degree angle (right angle) while you slowly  move your forearm up toward the ceiling. Move your forearm up to the height of the bed, toward your head. This is external rotation. ? Your upper arm should not move. ? At the top of the movement, your palm should face the floor. 5. Hold for __________ seconds. 6. Slowly return to the starting position and relax your muscles. Repeat __________ times. Complete this exercise __________ times a day. Rowing scapular retraction This is an exercise in which the shoulder blades (scapulae) are pulled toward each other (retraction). 1. Sit in a stable chair without armrests, or stand up. 2. Secure an exercise band to a stable object in front of you so the band is at shoulder height. 3. Hold one end of the exercise band in each hand. Your palms should face down. 4. Bring your arms out straight in front of you.  5. Step back, away from the secured end of the exercise band, until the band stretches. 6. Pull the band backward. As you do this, bend your elbows and squeeze your shoulder blades together, but avoid letting the rest of your body move. Do not shrug your shoulders upward while you do this. 7. Stop when your elbows are at your sides or slightly behind your body. 8. Hold for __________ seconds. 9. Slowly straighten your arms to return to the starting position. Repeat __________ times. Complete this exercise __________ times a day. Posture and body mechanics Good posture and healthy body mechanics can help to relieve stress in your body's tissues and joints. Body mechanics refers to the movements and positions of your body while you do your daily activities. Posture is part of body mechanics. Good posture means:  Your spine is in its natural S-curve position (neutral).  Your shoulders are pulled back slightly.  Your head is not tipped forward. Follow these guidelines to improve your posture and body mechanics in your everyday activities. Standing   When standing, keep your spine neutral and  your feet about hip width apart. Keep a slight bend in your knees. Your ears, shoulders, and hips should line up with each other.  When you do a task in which you lean forward while standing in one place for a long time, place one foot up on a stable object that is 2-4 inches (5-10 cm) high, such as a footstool. This helps keep your spine neutral. Sitting   When sitting, keep your spine neutral and keep your feet flat on the floor. Use a footrest, if necessary, and keep your thighs parallel to the floor. Avoid rounding your shoulders, and avoid tilting your head forward.  When working at a desk or a computer, keep your desk at a height where your hands are slightly lower than your elbows. Slide your chair under your desk so you are close enough to maintain good posture.  When working at a computer, place your monitor at a height where you are looking straight ahead and you do not have to tilt your head forward or downward to look at the screen. Resting When lying down and resting, avoid positions that are most painful for you.  If you have pain with activities such as sitting, bending, stooping, or squatting (flexion-basedactivities), lie in a position in which your body does not bend very much. For example, avoid curling up on your side with your arms and knees near your chest (fetal position).  If you have pain with activities such as standing for a long time or reaching with your arms (extension-basedactivities), lie with your spine in a neutral position and bend your knees slightly. Try the following positions: ? Lie on your side with a pillow between your knees. ? Lie on your back with a pillow under your knees.  Lifting   When lifting objects, keep your feet at least shoulder width apart and tighten your abdominal muscles.  Bend your knees and hips and keep your spine neutral. It is important to lift using the strength of your legs, not your back. Do not lock your knees straight  out.  Always ask for help to lift heavy or awkward objects. This information is not intended to replace advice given to you by your health care provider. Make sure you discuss any questions you have with your health care provider. Document Revised: 06/26/2018 Document Reviewed: 04/13/2018 Elsevier Patient Education  2020 [employer]  Orders Placed This Encounter  Procedures  . Urine Culture  . Flu Vaccine QUAD 6+ mos PF IM (Fluarix Quad PF)  . Urinalysis, Routine w reflex microscopic  . POCT URINALYSIS DIP (CLINITEK)    Meds ordered this encounter  Medications  . meloxicam (MOBIC) 15 MG tablet    Sig: Take 1 tablet (15 mg total) by mouth daily.    Dispense:  30 tablet    Refill:  0  . cyclobenzaprine (FLEXERIL) 10 MG tablet    Sig: Take 0.5-1 tablets (5-10 mg total) by mouth 3 (three) times daily as needed for muscle spasms. Caution: can cause drowsiness    Dispense:  60 tablet    Refill:  1       Follow-up instructions: Return if symptoms worsen or fail to improve.                                         BP 124/86   Pulse 61   Temp 98.3 F (36.8 C)   Wt 208 lb 4.8 oz (94.5 kg)   SpO2 98%   BMI 33.37 kg/m   Current Meds  Medication Sig  . cyclobenzaprine (FLEXERIL) 10 MG tablet Take 0.5-1 tablets (5-10 mg total) by mouth 3 (three) times daily as needed for muscle spasms. Caution: can cause drowsiness  . famotidine (PEPCID) 20 MG tablet TAKE 1 TABLET BY MOUTH TWICE A DAY  . meloxicam (MOBIC) 15 MG tablet Take 1 tablet (15 mg total) by mouth daily.  . pantoprazole (PROTONIX) 40 MG tablet Take 1 tablet (40 mg total) by mouth 2 (two) times daily before a meal.    Results for orders placed or performed in visit on 03/13/20 (from the past 72 hour(s))  POCT URINALYSIS DIP (CLINITEK)     Status: Abnormal   Collection Time: 03/13/20 12:02 PM  Result Value Ref Range   Color, UA other (A) yellow    Comment: amber   Clarity, UA  clear clear   Glucose, UA negative negative mg/dL   Bilirubin, UA negative negative   Ketones, POC UA trace (5) (A) negative mg/dL   Spec Grav, UA >=2.585 (A) 1.010 - 1.025   Blood, UA negative negative   pH, UA 5.5 5.0 - 8.0   POC PROTEIN,UA negative negative, trace   Urobilinogen, UA 0.2 0.2 or 1.0 E.U./dL   Nitrite, UA Negative Negative   Leukocytes, UA Negative Negative    No results found.     All questions at time of visit were answered - patient instructed to contact office with any additional concerns or updates.  ER/RTC precautions were reviewed with the patient as applicable.   Please note: voice recognition software was used to produce this document, and typos may escape review. Please contact Dr. Lyn Hollingshead for any needed clarifications.

## 2020-03-14 LAB — URINE CULTURE
MICRO NUMBER:: 11359639
SPECIMEN QUALITY:: ADEQUATE

## 2020-03-14 LAB — URINALYSIS, ROUTINE W REFLEX MICROSCOPIC
Bilirubin Urine: NEGATIVE
Glucose, UA: NEGATIVE
Hgb urine dipstick: NEGATIVE
Ketones, ur: NEGATIVE
Leukocytes,Ua: NEGATIVE
Nitrite: NEGATIVE
Protein, ur: NEGATIVE
Specific Gravity, Urine: 1.02 (ref 1.001–1.03)
pH: 5.5 (ref 5.0–8.0)

## 2020-03-16 ENCOUNTER — Encounter: Payer: Self-pay | Admitting: Family Medicine

## 2020-06-02 NOTE — Progress Notes (Signed)
   Covid-19 Vaccination Clinic  Name:  Natalie Day    MRN: [id number] DOB: 07/12/1980  06/02/2020  [name] was observed post Covid-19 immunization for 15 minutes without incident. She was provided with Vaccine Information Sheet and instruction to access the V-Safe system.   [name] was instructed to call 911 with any severe reactions post vaccine: Marland Kitchen Difficulty breathing  . Swelling of face and throat  . A fast heartbeat  . A bad rash all over body  . Dizziness and weakness   Immunizations Administered    Name Date Dose VIS Date Route   Moderna COVID-19 Vaccine 02/16/2020 -- -- --   Moderna Covid-19 Booster Vaccine 02/16/2020  2:30 PM 0.25 mL 01/05/2020 Intramuscular   Manufacturer: Moderna   Lot: [id number]   NDC: 67893-810-17

## 2020-08-24 ENCOUNTER — Encounter: Payer: Self-pay | Admitting: Family Medicine

## 2020-08-25 ENCOUNTER — Encounter: Payer: Self-pay | Admitting: Family Medicine

## 2020-08-25 ENCOUNTER — Telehealth: Payer: Self-pay

## 2020-08-25 ENCOUNTER — Telehealth (INDEPENDENT_AMBULATORY_CARE_PROVIDER_SITE_OTHER): Payer: BC Managed Care – PPO | Admitting: Family Medicine

## 2020-08-25 ENCOUNTER — Other Ambulatory Visit: Payer: Self-pay

## 2020-08-25 DIAGNOSIS — U071 COVID-19: Secondary | ICD-10-CM | POA: Diagnosis not present

## 2020-08-25 MED ORDER — NIRMATRELVIR/RITONAVIR (PAXLOVID)TABLET
3.0000 | ORAL_TABLET | Freq: Two times a day (BID) | ORAL | 0 refills | Status: AC
Start: 2020-08-25 — End: 2020-08-30

## 2020-08-25 MED ORDER — AZITHROMYCIN 250 MG PO TABS: ORAL_TABLET | ORAL | 0 refills | Status: AC

## 2020-08-25 MED ORDER — HYDROCOD POLST-CPM POLST ER 10-8 MG/5ML PO SUER: 5.0000 mL | Freq: Two times a day (BID) | ORAL | 0 refills | Status: DC | PRN

## 2020-08-25 MED ORDER — BENZONATATE 200 MG PO CAPS
200.0000 mg | ORAL_CAPSULE | Freq: Two times a day (BID) | ORAL | 0 refills | Status: DC | PRN
Start: 2020-08-25 — End: 2020-10-06

## 2020-08-25 NOTE — Progress Notes (Signed)
Natalie Day - 40 y.o. female MRN [id number]  Date of birth: 13-Oct-1980   This visit type was conducted due to national recommendations for restrictions regarding the COVID-19 Pandemic (e.g. social distancing).  This format is felt to be most appropriate for this patient at this time.  All issues noted in this document were discussed and addressed.  No physical exam was performed (except for noted visual exam findings with Video Visits).  I discussed the limitations of evaluation and management by telemedicine and the availability of in person appointments. The patient expressed understanding and agreed to proceed.  I connected withNAME@ on 08/25/20 at  9:10 AM EDT by a video enabled telemedicine application and verified that I am speaking with the correct person using two identifiers.  Present at visit: Natalie Coombe, DO Natalie Day   Patient Location: Home [address] [place] [address]   Provider location:   [facility]  No chief complaint on file.   HPI  Natalie Day is a 39 y.o. female who presents via audio/video conferencing for a telehealth visit today.  She reports testing positive for COVID recently.  Symptoms started about 4 days ago and had positive test yesterday.  She has sore throat, harsh cough, headache, fatigue and congestion.  She denies shortness of breath, nausea, vomiting, diarrhea.  She is doing ok with fluid intake.  She is not taking anything OTC currently.  Had a little bit of leftover prescription cough syrup that she has used.  This has helped some.    ROS:  A comprehensive ROS was completed and negative except as noted per HPI  Past Medical History:  Diagnosis Date   Abnormal Pap smear 2001   Dysplasia, Colpo/BX, LEEP   Amenorrhea    Chlamydia    GERD (gastroesophageal reflux disease)    HPV (human papilloma virus) infection    Infection    STD (sexually transmitted disease)    Trichimoniasis     Past Surgical History:  Procedure  Laterality Date   CERVICAL BIOPSY  W/ LOOP ELECTRODE EXCISION  2001   CESAREAN SECTION     COLPOSCOPY     UPPER GASTROINTESTINAL ENDOSCOPY  15 years ago     Family History  Problem Relation Age of Onset   Healthy Mother    Healthy Father    Colon cancer Neg Hx    Pancreatic cancer Neg Hx    Esophageal cancer Neg Hx     Social History   Socioeconomic History   Marital status: Single    Spouse name: Not on file   Number of children: 1   Years of education: Masters   Highest education level: Not on file  Occupational History   Occupation: [place] [employer]   Tobacco Use   Smoking status: Never   Smokeless tobacco: Never  Vaping Use   Vaping Use: Never used  Substance and Sexual Activity   Alcohol use: No   Drug use: No   Sexual activity: Yes    Partners: Male    Birth control/protection: I.U.D.    Comment: Mirena  Other Topics Concern   Not on file  Social History Narrative   Lives alone   Caffeine use: soda/tea 3x/day   Right   Handed   Social Determinants of Health   Financial Resource Strain: Not on file  Food Insecurity: Not on file  Transportation Needs: Not on file  Physical Activity: Not on file  Stress: Not on file  Social Connections: Not on  file  Intimate Partner Violence: Not on file     Current Outpatient Medications:    azithromycin (ZITHROMAX) 250 MG tablet, Take 2 tablets on day 1, then 1 tablet daily on days 2 through 5, Disp: 6 tablet, Rfl: 0   benzonatate (TESSALON) 200 MG capsule, Take 1 capsule (200 mg total) by mouth 2 (two) times daily as needed for cough., Disp: 20 capsule, Rfl: 0   chlorpheniramine-HYDROcodone (TUSSIONEX PENNKINETIC ER) 10-8 MG/5ML SUER, Take 5 mLs by mouth every 12 (twelve) hours as needed for cough., Disp: 115 mL, Rfl: 0   nirmatrelvir/ritonavir EUA (PAXLOVID) TABS, Take 3 tablets by mouth 2 (two) times daily for 5 days. (Take nirmatrelvir 150 mg two tablets twice daily for 5 days and ritonavir 100 mg one tablet twice daily  for 5 days), Disp: 30 tablet, Rfl: 0   cyclobenzaprine (FLEXERIL) 10 MG tablet, Take 0.5-1 tablets (5-10 mg total) by mouth 3 (three) times daily as needed for muscle spasms. Caution: can cause drowsiness, Disp: 60 tablet, Rfl: 1   famotidine (PEPCID) 20 MG tablet, TAKE 1 TABLET BY MOUTH TWICE A DAY, Disp: 180 tablet, Rfl: 1   meloxicam (MOBIC) 15 MG tablet, Take 1 tablet (15 mg total) by mouth daily., Disp: 30 tablet, Rfl: 0   pantoprazole (PROTONIX) 40 MG tablet, Take 1 tablet (40 mg total) by mouth 2 (two) times daily before a meal., Disp: 180 tablet, Rfl: 5  EXAM:  VITALS per patient if applicable: Pulse 66   Temp (!) 97.5 F (36.4 C)   GENERAL: alert, oriented, appears well and in no acute distress  HEENT: atraumatic, conjunttiva clear, no obvious abnormalities on inspection of external nose and ears  NECK: normal movements of the head and neck  LUNGS: on inspection no signs of respiratory distress, breathing rate appears normal, no obvious gross SOB, gasping or wheezing  CV: no obvious cyanosis  MS: moves all visible extremities without noticeable abnormality  PSYCH/NEURO: pleasant and cooperative, no obvious depression or anxiety, speech and thought processing grossly intact  ASSESSMENT AND PLAN:  Discussed the following assessment and plan:  COVID-19 Currently with mild-moderate symptoms Adding paxlovid and azithromycin.  Increased fluids recommended Tussionex and tessalon as needed for cough.  Instructed to seek ER care for severe shortness of breath or inability to keep hydrated.      I discussed the assessment and treatment plan with the patient. The patient was provided an opportunity to ask questions and all were answered. The patient agreed with the plan and demonstrated an understanding of the instructions.   The patient was advised to call back or seek an in-person evaluation if the symptoms worsen or if the condition fails to improve as  anticipated.    Natalie Coombe, DO

## 2020-08-25 NOTE — Assessment & Plan Note (Signed)
Currently with mild-moderate symptoms Adding paxlovid and azithromycin.  Increased fluids recommended Tussionex and tessalon as needed for cough.  Instructed to seek ER care for severe shortness of breath or inability to keep hydrated.

## 2020-08-25 NOTE — Telephone Encounter (Signed)
Pt lvm requesting callback for appt.   Stated she lvm on main line and did not receive a callback.   Returned the pt's call. She states a callback for scheduling was received yesterday afternoon but no availability.   Patient tested positive for COVID. MyChart visit has been scheduled.

## 2020-09-07 ENCOUNTER — Other Ambulatory Visit: Payer: Self-pay | Admitting: Obstetrics and Gynecology

## 2020-09-07 DIAGNOSIS — Z1231 Encounter for screening mammogram for malignant neoplasm of breast: Secondary | ICD-10-CM

## 2020-09-13 ENCOUNTER — Other Ambulatory Visit: Payer: Self-pay

## 2020-09-13 ENCOUNTER — Ambulatory Visit
Admission: RE | Admit: 2020-09-13 | Discharge: 2020-09-13 | Disposition: A | Discharge status: Home / Self Care | Payer: BC Managed Care – PPO | Source: Ambulatory Visit | Attending: Obstetrics and Gynecology | Admitting: Obstetrics and Gynecology

## 2020-09-13 DIAGNOSIS — Z1231 Encounter for screening mammogram for malignant neoplasm of breast: Secondary | ICD-10-CM

## 2020-09-13 LAB — HM MAMMOGRAPHY

## 2020-09-15 ENCOUNTER — Other Ambulatory Visit: Payer: Self-pay | Admitting: Physician Assistant

## 2020-09-15 DIAGNOSIS — K2100 Gastro-esophageal reflux disease with esophagitis, without bleeding: Secondary | ICD-10-CM

## 2020-09-19 ENCOUNTER — Other Ambulatory Visit: Payer: Self-pay | Admitting: Obstetrics and Gynecology

## 2020-09-19 DIAGNOSIS — R928 Other abnormal and inconclusive findings on diagnostic imaging of breast: Secondary | ICD-10-CM

## 2020-10-06 ENCOUNTER — Ambulatory Visit: Payer: BC Managed Care – PPO | Admitting: Family Medicine

## 2020-10-06 ENCOUNTER — Ambulatory Visit
Admission: RE | Admit: 2020-10-06 | Discharge: 2020-10-06 | Disposition: A | Discharge status: Home / Self Care | Payer: BC Managed Care – PPO | Source: Ambulatory Visit | Attending: Obstetrics and Gynecology | Admitting: Obstetrics and Gynecology

## 2020-10-06 ENCOUNTER — Encounter: Payer: Self-pay | Admitting: Family Medicine

## 2020-10-06 ENCOUNTER — Other Ambulatory Visit: Payer: Self-pay | Admitting: Obstetrics and Gynecology

## 2020-10-06 ENCOUNTER — Other Ambulatory Visit: Payer: Self-pay

## 2020-10-06 DIAGNOSIS — H6982 Other specified disorders of Eustachian tube, left ear: Secondary | ICD-10-CM | POA: Diagnosis not present

## 2020-10-06 DIAGNOSIS — R928 Other abnormal and inconclusive findings on diagnostic imaging of breast: Secondary | ICD-10-CM

## 2020-10-06 LAB — HM MAMMOGRAPHY

## 2020-10-06 NOTE — Patient Instructions (Signed)
Start flonase daily.  You can add sudafed for a few days if needed to help dry fluid up.   Eustachian Tube Dysfunction  Eustachian tube dysfunction refers to a condition in which a blockage develops in the narrow passage that connects the middle ear to the back of the nose (eustachian tube). The eustachian tube regulates air pressure in the middle ear by letting air move between the ear and nose. It also helps to drain fluid from the middle earspace. Eustachian tube dysfunction can affect one or both ears. When the eustachian tube does not function properly, air pressure, fluid, or both can build up inthe middle ear. What are the causes? This condition occurs when the eustachian tube becomes blocked or cannot open normally. Common causes of this condition include: Ear infections. Colds and other infections that affect the nose, mouth, and throat (upper respiratory tract). Allergies. Irritation from cigarette smoke. Irritation from stomach acid coming up into the esophagus (gastroesophageal reflux). The esophagus is the tube that carries food from the mouth to the stomach. Sudden changes in air pressure, such as from descending in an airplane or scuba diving. Abnormal growths in the nose or throat, such as: Growths that line the nose (nasal polyps). Abnormal growth of cells (tumors). Enlarged tissue at the back of the throat (adenoids). What increases the risk? You are more likely to develop this condition if: You smoke. You are overweight. You are a child who has: Certain birth defects of the mouth, such as cleft palate. Large tonsils or adenoids. What are the signs or symptoms? Common symptoms of this condition include: A feeling of fullness in the ear. Ear pain. Clicking or popping noises in the ear. Ringing in the ear. Hearing loss. Loss of balance. Dizziness. Symptoms may get worse when the air pressure around you changes, such as whenyou travel to an area of high elevation,  fly on an airplane, or go scuba diving. How is this diagnosed? This condition may be diagnosed based on: Your symptoms. A physical exam of your ears, nose, and throat. Tests, such as those that measure: The movement of your eardrum (tympanogram). Your hearing (audiometry). How is this treated? Treatment depends on the cause and severity of your condition. In mild cases, you may relieve your symptoms by moving air into your ears. This is called "popping the ears." In more severe cases, or if you have symptoms of fluid in your ears, treatment may include: Medicines to relieve congestion (decongestants). Medicines that treat allergies (antihistamines). Nasal sprays or ear drops that contain medicines that reduce swelling (steroids). A procedure to drain the fluid in your eardrum (myringotomy). In this procedure, a small tube is placed in the eardrum to: Drain the fluid. Restore the air in the middle ear space. A procedure to insert a balloon device through the nose to inflate the opening of the eustachian tube (balloon dilation). Follow these instructions at home: Lifestyle Do not do any of the following until your health care provider approves: Travel to high altitudes. Fly in airplanes. Work in a Estate agent or room. Scuba dive. Do not use any products that contain nicotine or tobacco, such as cigarettes and e-cigarettes. If you need help quitting, ask your health care provider. Keep your ears dry. Wear fitted earplugs during showering and bathing. Dry your ears completely after. General instructions Take over-the-counter and prescription medicines only as told by your health care provider. Use techniques to help pop your ears as recommended by your health care provider. These  may include: Chewing gum. Yawning. Frequent, forceful swallowing. Closing your mouth, holding your nose closed, and gently blowing as if you are trying to blow air out of your nose. Keep all follow-up  visits as told by your health care provider. This is important. Contact a health care provider if: Your symptoms do not go away after treatment. Your symptoms come back after treatment. You are unable to pop your ears. You have: A fever. Pain in your ear. Pain in your head or neck. Fluid draining from your ear. Your hearing suddenly changes. You become very dizzy. You lose your balance. Summary Eustachian tube dysfunction refers to a condition in which a blockage develops in the eustachian tube. It can be caused by ear infections, allergies, inhaled irritants, or abnormal growths in the nose or throat. Symptoms include ear pain, hearing loss, or ringing in the ears. Mild cases are treated with maneuvers to unblock the ears, such as yawning or ear popping. Severe cases are treated with medicines. Surgery may also be done (rare). This information is not intended to replace advice given to you by your health care provider. Make sure you discuss any questions you have with your healthcare provider. Document Revised: 06/24/2017 Document Reviewed: 06/24/2017 Elsevier Patient Education  2022 [employer].

## 2020-10-08 DIAGNOSIS — H6980 Other specified disorders of Eustachian tube, unspecified ear: Secondary | ICD-10-CM | POA: Insufficient documentation

## 2020-10-08 DIAGNOSIS — H6990 Unspecified Eustachian tube disorder, unspecified ear: Secondary | ICD-10-CM | POA: Insufficient documentation

## 2020-10-08 NOTE — Progress Notes (Signed)
Natalie Day - 40 y.o. female MRN [id number]  Date of birth: Nov 16, 1980  Subjective Chief Complaint  Patient presents with   Dizziness    HPI Natalie Day is a 40 year old female here today with complaint of dizziness.  She reports that dizziness started a few weeks ago.  She did have COVID about a month ago and her symptoms started shortly after this.  Seems to be worse when standing too quickly or moving her head too quickly however it can occur at other random times.  She has not had any associated headaches, vision changes or nausea.  She does feel ear pressure.  She denies any ear pain.  She has not tried anything for management of symptoms.  ROS:  A comprehensive ROS was completed and negative except as noted per HPI  No Known Allergies  Past Medical History:  Diagnosis Date   Abnormal Pap smear 2001   Dysplasia, Colpo/BX, LEEP   Amenorrhea    Chlamydia    GERD (gastroesophageal reflux disease)    HPV (human papilloma virus) infection    Infection    STD (sexually transmitted disease)    Trichimoniasis     Past Surgical History:  Procedure Laterality Date   CERVICAL BIOPSY  W/ LOOP ELECTRODE EXCISION  2001   CESAREAN SECTION     COLPOSCOPY     UPPER GASTROINTESTINAL ENDOSCOPY  15 years ago     Social History   Socioeconomic History   Marital status: Single    Spouse name: Not on file   Number of children: 1   Years of education: Masters   Highest education level: Not on file  Occupational History   Occupation: [employer]   Tobacco Use   Smoking status: Never   Smokeless tobacco: Never  Vaping Use   Vaping Use: Never used  Substance and Sexual Activity   Alcohol use: No   Drug use: No   Sexual activity: Yes    Partners: Male    Birth control/protection: I.U.D.    Comment: Mirena  Other Topics Concern   Not on file  Social History Narrative   Lives alone   Caffeine use: soda/tea 3x/day   Right   Handed   Social Determinants of Health    Financial Resource Strain: Not on file  Food Insecurity: Not on file  Transportation Needs: Not on file  Physical Activity: Not on file  Stress: Not on file  Social Connections: Not on file    Family History  Problem Relation Age of Onset   Healthy Mother    Healthy Father    Colon cancer Neg Hx    Pancreatic cancer Neg Hx    Esophageal cancer Neg Hx     Health Maintenance  Topic Date Due   Hepatitis C Screening  Never done   PAP SMEAR-Modifier  03/18/2020   INFLUENZA VACCINE  10/16/2020   TETANUS/TDAP  09/07/2029   COVID-19 Vaccine  Completed   HIV Screening  Completed   Pneumococcal Vaccine 73-50 Years old  Aged Out   HPV VACCINES  Aged Out     ----------------------------------------------------------------------------------------------------------------------------------------------------------------------------------------------------------------- Physical Exam Temp 97.8 F (36.6 C)   Ht 5\' 7"  (1.702 m)   Wt 201 lb (91.2 kg)   SpO2 97%   BMI 31.48 kg/m   Physical Exam Constitutional:      Appearance: Normal appearance.  HENT:     Head: Normocephalic and atraumatic.     Right Ear: Tympanic membrane normal.     Ears:  Comments: Serous effusion of the left ear. Eyes:     General: No scleral icterus. Cardiovascular:     Rate and Rhythm: Normal rate and regular rhythm.  Pulmonary:     Effort: Pulmonary effort is normal.     Breath sounds: Normal breath sounds.  Musculoskeletal:     Cervical back: Neck supple.  Skin:    General: Skin is warm and dry.  Neurological:     General: No focal deficit present.     Mental Status: She is alert and oriented to person, place, and time.     Cranial Nerves: No cranial nerve deficit.     Motor: No weakness.  Psychiatric:        Mood and Affect: Mood normal.        Behavior: Behavior normal.     ------------------------------------------------------------------------------------------------------------------------------------------------------------------------------------------------------------------- Assessment and Plan  Eustachian tube dysfunction She has serous effusion of the left TM that is likely related to eustachian tube dysfunction.  Movement caused by her previous COVID infection as symptoms correlate with starting shortly after this.  Recommend adding Flonase daily.  She can use Sudafed for a few days as well if needed.  Instructed to follow-up if symptoms are not improving.   No orders of the defined types were placed in this encounter.   No follow-ups on file.    This visit occurred during the SARS-CoV-2 public health emergency.  Safety protocols were in place, including screening questions prior to the visit, additional usage of staff PPE, and extensive cleaning of exam room while observing appropriate contact time as indicated for disinfecting solutions.

## 2020-10-08 NOTE — Assessment & Plan Note (Addendum)
She has serous effusion of the left TM that is likely related to eustachian tube dysfunction.  Movement caused by her previous COVID infection as symptoms correlate with starting shortly after this.  Recommend adding Flonase daily.  She can use Sudafed for a few days as well if needed.  Instructed to follow-up if symptoms are not improving.

## 2020-10-11 ENCOUNTER — Other Ambulatory Visit: Payer: Self-pay | Admitting: Obstetrics and Gynecology

## 2020-10-11 ENCOUNTER — Ambulatory Visit
Admission: RE | Admit: 2020-10-11 | Discharge: 2020-10-11 | Disposition: A | Discharge status: Home / Self Care | Payer: BC Managed Care – PPO | Source: Ambulatory Visit | Attending: Obstetrics and Gynecology | Admitting: Obstetrics and Gynecology

## 2020-10-11 ENCOUNTER — Other Ambulatory Visit: Payer: Self-pay

## 2020-10-11 DIAGNOSIS — R928 Other abnormal and inconclusive findings on diagnostic imaging of breast: Secondary | ICD-10-CM

## 2020-10-11 HISTORY — PX: BREAST BIOPSY: SHX20

## 2020-10-11 LAB — HM MAMMOGRAPHY

## 2020-11-21 LAB — HM PAP SMEAR: HM Pap smear: NEGATIVE

## 2020-12-30 ENCOUNTER — Other Ambulatory Visit: Payer: Self-pay | Admitting: Family Medicine

## 2020-12-30 DIAGNOSIS — K2100 Gastro-esophageal reflux disease with esophagitis, without bleeding: Secondary | ICD-10-CM

## 2021-01-22 ENCOUNTER — Other Ambulatory Visit: Payer: Self-pay | Admitting: Obstetrics and Gynecology

## 2021-01-22 DIAGNOSIS — N6011 Diffuse cystic mastopathy of right breast: Secondary | ICD-10-CM

## 2021-02-14 ENCOUNTER — Ambulatory Visit
Admission: EM | Admit: 2021-02-14 | Discharge: 2021-02-14 | Disposition: A | Discharge status: Home / Self Care | Payer: BC Managed Care – PPO | Attending: Emergency Medicine | Admitting: Emergency Medicine

## 2021-02-14 ENCOUNTER — Other Ambulatory Visit: Payer: Self-pay

## 2021-02-14 DIAGNOSIS — M5410 Radiculopathy, site unspecified: Secondary | ICD-10-CM | POA: Diagnosis not present

## 2021-02-14 DIAGNOSIS — M62838 Other muscle spasm: Secondary | ICD-10-CM

## 2021-02-14 DIAGNOSIS — G5682 Other specified mononeuropathies of left upper limb: Secondary | ICD-10-CM

## 2021-02-14 MED ORDER — KETOROLAC TROMETHAMINE 60 MG/2ML IM SOLN
60.0000 mg | Freq: Once | INTRAMUSCULAR | Status: AC
  Administered 2021-02-14: 60 mg via INTRAMUSCULAR

## 2021-02-14 MED ORDER — BACLOFEN 10 MG PO TABS: 10.0000 mg | ORAL_TABLET | Freq: Three times a day (TID) | ORAL | 0 refills | Status: AC

## 2021-02-14 MED ORDER — IBUPROFEN 800 MG PO TABS: 800.0000 mg | ORAL_TABLET | Freq: Three times a day (TID) | ORAL | 0 refills | Status: DC
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
## 2021-03-30 ENCOUNTER — Other Ambulatory Visit: Payer: Self-pay | Admitting: Family Medicine

## 2021-03-30 DIAGNOSIS — K2100 Gastro-esophageal reflux disease with esophagitis, without bleeding: Secondary | ICD-10-CM

## 2021-04-16 ENCOUNTER — Ambulatory Visit
Admission: RE | Admit: 2021-04-16 | Discharge: 2021-04-16 | Disposition: A | Discharge status: Home / Self Care | Payer: BC Managed Care – PPO | Source: Ambulatory Visit | Attending: Obstetrics and Gynecology | Admitting: Obstetrics and Gynecology

## 2021-04-16 ENCOUNTER — Other Ambulatory Visit: Payer: Self-pay | Admitting: Obstetrics and Gynecology

## 2021-04-16 ENCOUNTER — Ambulatory Visit: Payer: BC Managed Care – PPO

## 2021-04-16 DIAGNOSIS — N6011 Diffuse cystic mastopathy of right breast: Secondary | ICD-10-CM

## 2021-04-16 LAB — HM MAMMOGRAPHY

## 2021-05-09 ENCOUNTER — Other Ambulatory Visit: Payer: Self-pay

## 2021-05-09 ENCOUNTER — Ambulatory Visit: Payer: BC Managed Care – PPO | Admitting: Family Medicine

## 2021-05-09 ENCOUNTER — Encounter: Payer: Self-pay | Admitting: Family Medicine

## 2021-05-09 DIAGNOSIS — B029 Zoster without complications: Secondary | ICD-10-CM | POA: Diagnosis not present

## 2021-05-09 MED ORDER — VALACYCLOVIR HCL 1 G PO TABS: 1000.0000 mg | ORAL_TABLET | Freq: Three times a day (TID) | ORAL | 0 refills | Status: AC

## 2021-05-09 MED ORDER — PREDNISONE 10 MG (21) PO TBPK: ORAL_TABLET | ORAL | 0 refills | Status: DC

## 2021-05-09 NOTE — Assessment & Plan Note (Signed)
Still have typical rash that would be associated with shingles however dermatomal distribution and pain are consistent with shingles.  Adding course of Valtrex as well as course of prednisone to help with pain control.  She will let me know if symptoms change or worsen.

## 2021-05-09 NOTE — Progress Notes (Signed)
Natalie Day - 41 y.o. female MRN [id number]  Date of birth: [birth year]  Subjective Chief Complaint  Patient presents with   Herpes Zoster    HPI Natalie Day is a 41 year old female here today with complaint of possible shingles.  Recently seen by her chiropractor for back pain and was told she had a rash that was concerning for shingles.  She does have pain in linear distribution just under her bra strap on the left side.  Her symptoms started a few days ago.  She has not noticed any vesicular lesions or blisters.  She denies any cold or flulike symptoms, fevers or fatigue.  ROS:  A comprehensive ROS was completed and negative except as noted per HPI  No Known Allergies  Past Medical History:  Diagnosis Date   Abnormal Pap smear 2001   Dysplasia, Colpo/BX, LEEP   Amenorrhea    Chlamydia    GERD (gastroesophageal reflux disease)    HPV (human papilloma virus) infection    Infection    STD (sexually transmitted disease)    Trichimoniasis     Past Surgical History:  Procedure Laterality Date   BREAST BIOPSY Right 10/11/2020   CERVICAL BIOPSY  W/ LOOP ELECTRODE EXCISION  2001   CESAREAN SECTION     COLPOSCOPY     UPPER GASTROINTESTINAL ENDOSCOPY  15 years ago     Social History   Socioeconomic History   Marital status: Single    Spouse name: Not on file   Number of children: 1   Years of education: Masters   Highest education level: Not on file  Occupational History   Occupation: [employer]   Tobacco Use   Smoking status: Never   Smokeless tobacco: Never  Vaping Use   Vaping Use: Never used  Substance and Sexual Activity   Alcohol use: No   Drug use: No   Sexual activity: Yes    Partners: Male    Birth control/protection: I.U.D.    Comment: Mirena  Other Topics Concern   Not on file  Social History Narrative   Lives alone   Caffeine use: soda/tea 3x/day   Right   Handed   Social Determinants of Health   Financial Resource Strain: Not on file  Food  Insecurity: Not on file  Transportation Needs: Not on file  Physical Activity: Not on file  Stress: Not on file  Social Connections: Not on file    Family History  Problem Relation Age of Onset   Healthy Mother    Healthy Father    Colon cancer Neg Hx    Pancreatic cancer Neg Hx    Esophageal cancer Neg Hx     Health Maintenance  Topic Date Due   Hepatitis C Screening  Never done   PAP SMEAR-Modifier  03/18/2020   COVID-19 Vaccine (4 - Booster for Moderna series) 04/12/2020   TETANUS/TDAP  09/07/2029   INFLUENZA VACCINE  Completed   HIV Screening  Completed   HPV VACCINES  Aged Out     ----------------------------------------------------------------------------------------------------------------------------------------------------------------------------------------------------------------- Physical Exam BP 133/86 (BP Location: Left Arm, Patient Position: Sitting, Cuff Size: Large)    Pulse 77    Ht 5\' 7"  (1.702 m)    Wt 210 lb 14.4 oz (95.7 kg)    LMP 03/18/2006    SpO2 96%    BMI 33.03 kg/m   Physical Exam Constitutional:      Appearance: Normal appearance.  Cardiovascular:     Rate and Rhythm: Normal rate and regular rhythm.  Pulmonary:     Effort: Pulmonary effort is normal.     Breath sounds: Normal breath sounds.  Musculoskeletal:     Cervical back: Neck supple.  Skin:    Comments: Tenderness to palpation in dermatomal pattern along the left thorax.  There is some mild erythema without vesicular lesions.  Neurological:     Mental Status: She is alert.    ------------------------------------------------------------------------------------------------------------------------------------------------------------------------------------------------------------------- Assessment and Plan  Zoster Still have typical rash that would be associated with shingles however dermatomal distribution and pain are consistent with shingles.  Adding course of Valtrex as well as  course of prednisone to help with pain control.  She will let me know if symptoms change or worsen.   Meds ordered this encounter  Medications   predniSONE (STERAPRED UNI-PAK 21 TAB) 10 MG (21) TBPK tablet    Sig: Taper as directed on packaging.    Dispense:  21 tablet    Refill:  0   valACYclovir (VALTREX) 1000 MG tablet    Sig: Take 1 tablet (1,000 mg total) by mouth 3 (three) times daily for 7 days.    Dispense:  21 tablet    Refill:  0    No follow-ups on file.    This visit occurred during the SARS-CoV-2 public health emergency.  Safety protocols were in place, including screening questions prior to the visit, additional usage of staff PPE, and extensive cleaning of exam room while observing appropriate contact time as indicated for disinfecting solutions.

## 2021-05-09 NOTE — Patient Instructions (Signed)

## 2021-05-13 ENCOUNTER — Other Ambulatory Visit: Payer: Self-pay | Admitting: Family Medicine

## 2021-05-13 DIAGNOSIS — K2100 Gastro-esophageal reflux disease with esophagitis, without bleeding: Secondary | ICD-10-CM

## 2021-06-10 ENCOUNTER — Other Ambulatory Visit: Payer: Self-pay | Admitting: Family Medicine

## 2021-06-10 DIAGNOSIS — K2100 Gastro-esophageal reflux disease with esophagitis, without bleeding: Secondary | ICD-10-CM

## 2021-06-15 ENCOUNTER — Encounter: Payer: Self-pay | Admitting: Family Medicine

## 2021-06-15 NOTE — Progress Notes (Signed)
Negative for intraepithelial lesion or malignancy.  

## 2021-07-21 ENCOUNTER — Other Ambulatory Visit: Payer: Self-pay | Admitting: Family Medicine

## 2021-07-21 DIAGNOSIS — K2100 Gastro-esophageal reflux disease with esophagitis, without bleeding: Secondary | ICD-10-CM

## 2021-07-23 NOTE — Telephone Encounter (Signed)
Patient has been scheduled for 09/25/21. AMUCK 

## 2021-07-23 NOTE — Telephone Encounter (Signed)
Please contact the patient to schedule appt due in July. Thank you ?

## 2021-08-24 ENCOUNTER — Other Ambulatory Visit: Payer: Self-pay | Admitting: Family Medicine

## 2021-08-24 DIAGNOSIS — K2100 Gastro-esophageal reflux disease with esophagitis, without bleeding: Secondary | ICD-10-CM

## 2021-09-14 ENCOUNTER — Ambulatory Visit: Payer: BC Managed Care – PPO

## 2021-09-14 ENCOUNTER — Ambulatory Visit
Admission: RE | Admit: 2021-09-14 | Discharge: 2021-09-14 | Disposition: A | Discharge status: Home / Self Care | Payer: BC Managed Care – PPO | Source: Ambulatory Visit | Attending: Obstetrics and Gynecology | Admitting: Obstetrics and Gynecology

## 2021-09-14 DIAGNOSIS — N6011 Diffuse cystic mastopathy of right breast: Secondary | ICD-10-CM

## 2021-09-25 ENCOUNTER — Ambulatory Visit: Payer: BC Managed Care – PPO | Admitting: Family Medicine

## 2021-09-25 ENCOUNTER — Encounter: Payer: Self-pay | Admitting: Family Medicine

## 2021-09-25 VITALS — BP 106/71 | HR 64 | Ht 67.0 in | Wt 210.0 lb

## 2021-09-25 DIAGNOSIS — K2100 Gastro-esophageal reflux disease with esophagitis, without bleeding: Secondary | ICD-10-CM | POA: Diagnosis not present

## 2021-09-25 DIAGNOSIS — F5101 Primary insomnia: Secondary | ICD-10-CM

## 2021-09-25 DIAGNOSIS — Z0000 Encounter for general adult medical examination without abnormal findings: Secondary | ICD-10-CM | POA: Diagnosis not present

## 2021-09-25 DIAGNOSIS — Z13220 Encounter for screening for lipoid disorders: Secondary | ICD-10-CM

## 2021-09-25 MED ORDER — PANTOPRAZOLE SODIUM 40 MG PO TBEC: DELAYED_RELEASE_TABLET | ORAL | 3 refills | Status: DC

## 2021-09-25 NOTE — Progress Notes (Signed)
Natalie Day - 41 y.o. female MRN [id number]  Date of birth: Jul 29, 1980  Subjective Chief Complaint  Patient presents with   Medication Refill    HPI Natalie Day is a 41 y.o. female here today for follow up visit.    She continues on pantoprazole for management of GERD.  This continues to work well for her.  She has tried to decrease dosing and has had breakthrough symptoms.  She is tolerating well otherwise.    She would like to have lab orders placed for upcoming annual exam.   ROS:  A comprehensive ROS was completed and negative except as noted per HPI  No Known Allergies  Past Medical History:  Diagnosis Date   Abnormal Pap smear 2001   Dysplasia, Colpo/BX, LEEP   Amenorrhea    Chlamydia    GERD (gastroesophageal reflux disease)    HPV (human papilloma virus) infection    Infection    STD (sexually transmitted disease)    Trichimoniasis     Past Surgical History:  Procedure Laterality Date   BREAST BIOPSY Right 10/11/2020   CERVICAL BIOPSY  W/ LOOP ELECTRODE EXCISION  2001   CESAREAN SECTION     COLPOSCOPY     UPPER GASTROINTESTINAL ENDOSCOPY  15 years ago     Social History   Socioeconomic History   Marital status: Single    Spouse name: Not on file   Number of children: 1   Years of education: Masters   Highest education level: Not on file  Occupational History   Occupation: [employer]   Tobacco Use   Smoking status: Never   Smokeless tobacco: Never  Vaping Use   Vaping Use: Never used  Substance and Sexual Activity   Alcohol use: No   Drug use: No   Sexual activity: Yes    Partners: Male    Birth control/protection: I.U.D.    Comment: Mirena  Other Topics Concern   Not on file  Social History Narrative   Lives alone   Caffeine use: soda/tea 3x/day   Right   Handed   Social Determinants of Health   Financial Resource Strain: Not on file  Food Insecurity: Not on file  Transportation Needs: Not on file  Physical Activity: Not on file   Stress: Not on file  Social Connections: Not on file    Family History  Problem Relation Age of Onset   Breast cancer Mother 79       Invasive Lobular Carcinoma   Healthy Father    Colon cancer Neg Hx    Pancreatic cancer Neg Hx    Esophageal cancer Neg Hx     Health Maintenance  Topic Date Due   COVID-19 Vaccine (4 - Moderna series) 12/26/2021 (Originally 04/12/2020)   Hepatitis C Screening  09/26/2022 (Originally 04/17/1996)   INFLUENZA VACCINE  10/16/2021   PAP SMEAR-Modifier  11/22/2023   TETANUS/TDAP  09/07/2029   HIV Screening  Completed   HPV VACCINES  Aged Out     ----------------------------------------------------------------------------------------------------------------------------------------------------------------------------------------------------------------- Physical Exam BP 106/71 (BP Location: Left Arm, Patient Position: Sitting, Cuff Size: Large)   Pulse 64   Ht 5\' 7"  (1.702 m)   Wt 210 lb (95.3 kg)   LMP 03/18/2006   SpO2 98%   BMI 32.89 kg/m   Physical Exam Constitutional:      Appearance: Normal appearance.  Eyes:     General: No scleral icterus. Musculoskeletal:     Cervical back: Neck supple.  Neurological:  Mental Status: She is alert.  Psychiatric:        Mood and Affect: Mood normal.        Behavior: Behavior normal.     ------------------------------------------------------------------------------------------------------------------------------------------------------------------------------------------------------------------- Assessment and Plan  GASTROESOPHAGEAL REFLUX, NO ESOPHAGITIS She continues to do well with pantoprazole at current strength.  Will plan to continue.  Would like to decreases some if tolerated in the future.    Meds ordered this encounter  Medications   pantoprazole (PROTONIX) 40 MG tablet    Sig: TAKE 1 TABLET (40 MG TOTAL) BY MOUTH TWICE A DAY BEFORE MEALS    Dispense:  180 tablet    Refill:  3     No follow-ups on file.    This visit occurred during the SARS-CoV-2 public health emergency.  Safety protocols were in place, including screening questions prior to the visit, additional usage of staff PPE, and extensive cleaning of exam room while observing appropriate contact time as indicated for disinfecting solutions.

## 2021-09-25 NOTE — Assessment & Plan Note (Signed)
She continues to do well with pantoprazole at current strength.  Will plan to continue.  Would like to decreases some if tolerated in the future.

## 2021-09-25 NOTE — Patient Instructions (Signed)
You can try valerian root at bedtime to help with sleep.

## 2021-09-26 LAB — CBC WITH DIFFERENTIAL/PLATELET
Absolute Monocytes: 491 cells/uL (ref 200–950)
Basophils Absolute: 88 cells/uL (ref 0–200)
Basophils Relative: 1.4 %
Eosinophils Absolute: 183 cells/uL (ref 15–500)
Eosinophils Relative: 2.9 %
HCT: 42 % (ref 35.0–45.0)
Hemoglobin: 13.9 g/dL (ref 11.7–15.5)
Lymphs Abs: 1922 cells/uL (ref 850–3900)
MCH: 28.8 pg (ref 27.0–33.0)
MCHC: 33.1 g/dL (ref 32.0–36.0)
MCV: 87 fL (ref 80.0–100.0)
MPV: 14 fL — ABNORMAL HIGH (ref 7.5–12.5)
Monocytes Relative: 7.8 %
Neutro Abs: 3616 cells/uL (ref 1500–7800)
Neutrophils Relative %: 57.4 %
Platelets: 149 10*3/uL (ref 140–400)
RBC: 4.83 10*6/uL (ref 3.80–5.10)
RDW: 13.1 % (ref 11.0–15.0)
Total Lymphocyte: 30.5 %
WBC: 6.3 10*3/uL (ref 3.8–10.8)

## 2021-09-26 LAB — COMPLETE METABOLIC PANEL WITH GFR
AG Ratio: 2 (calc) (ref 1.0–2.5)
ALT: 18 U/L (ref 6–29)
AST: 16 U/L (ref 10–30)
Albumin: 4.6 g/dL (ref 3.6–5.1)
Alkaline phosphatase (APISO): 76 U/L (ref 31–125)
BUN: 8 mg/dL (ref 7–25)
CO2: 27 mmol/L (ref 20–32)
Calcium: 9.7 mg/dL (ref 8.6–10.2)
Chloride: 107 mmol/L (ref 98–110)
Creat: 0.84 mg/dL (ref 0.50–0.99)
Globulin: 2.3 g/dL (calc) (ref 1.9–3.7)
Glucose, Bld: 84 mg/dL (ref 65–99)
Potassium: 4.4 mmol/L (ref 3.5–5.3)
Sodium: 142 mmol/L (ref 135–146)
Total Bilirubin: 0.5 mg/dL (ref 0.2–1.2)
Total Protein: 6.9 g/dL (ref 6.1–8.1)
eGFR: 88 mL/min/{1.73_m2} (ref >=60)

## 2021-09-26 LAB — LIPID PANEL W/REFLEX DIRECT LDL
Cholesterol: 197 mg/dL (ref <200)
HDL: 40 mg/dL — ABNORMAL LOW (ref >=50)
LDL Cholesterol (Calc): 122 mg/dL (calc) — ABNORMAL HIGH
Non-HDL Cholesterol (Calc): 157 mg/dL (calc) — ABNORMAL HIGH (ref <130)
Total CHOL/HDL Ratio: 4.9 (calc) (ref <5.0)
Triglycerides: 234 mg/dL — ABNORMAL HIGH (ref <150)

## 2021-09-26 LAB — TSH: TSH: 1.21 mIU/L

## 2021-10-10 ENCOUNTER — Telehealth (INDEPENDENT_AMBULATORY_CARE_PROVIDER_SITE_OTHER): Payer: BC Managed Care – PPO | Admitting: Family Medicine

## 2021-10-10 DIAGNOSIS — J029 Acute pharyngitis, unspecified: Secondary | ICD-10-CM | POA: Insufficient documentation

## 2021-10-10 MED ORDER — PHENOL 1.4 % MT LIQD: 1.0000 | OROMUCOSAL | 0 refills | Status: DC | PRN

## 2021-10-10 NOTE — Progress Notes (Addendum)
   Acute Office Visit  Subjective:     Patient ID: Natalie Day, female    DOB: [birth year], 41 y.o.   MRN: [id number]  Chief Complaint  Patient presents with   Sore Throat   I connected with  Natalie Day on 12/10/21 by a video and audio enabled telemedicine application and verified that I am speaking with the correct person using two identifiers.  Patient Location: Home  Provider Location: Office/Clinic  I discussed the limitations of evaluation and management by telemedicine. The patient expressed understanding and agreed to proceed.    Pt presents via Telehealth visit for pharyngitis. She was seen at Urgent care last week and tested negative for strep. She did have some fluid on her ears. She was given an abx for 10 days and is still not feeling well. Denies fevers, chills. Pain with swallowing. Does admit to mild neck pain but nothing abnormal per her report. Right ear and right side of her throat are painful. Denies fatigue, denies body aches.     Review of Systems  Constitutional:  Negative for chills and fever.  HENT:  Positive for sore throat.   Respiratory:  Negative for cough and shortness of breath.   Cardiovascular:  Negative for chest pain.  Neurological:  Negative for headaches.        Objective:    LMP 03/18/2006   Physical Exam Constitutional:      Appearance: She is well-developed.  HENT:     Head: Normocephalic and atraumatic.  Neurological:     Mental Status: She is alert.  Psychiatric:        Mood and Affect: Mood normal.        Behavior: Behavior normal.     No results found for any visits on 10/10/21.      Assessment & Plan:   Problem List Items Addressed This Visit       Respiratory   Acute pharyngitis - Primary    - sounds viral in nature since pt had recent strep negative  - provided pt with symptomatic relief chlorseptic spray - rec salt water gargles, sleep - if no better please follow up in person with me for a visit        Relevant Medications   phenol (CHLORASEPTIC) 1.4 % LIQD    Meds ordered this encounter  Medications   phenol (CHLORASEPTIC) 1.4 % LIQD    Sig: Use as directed 1 spray in the mouth or throat as needed for up to 7 days for throat irritation / pain.    Dispense:  29 mL    Refill:  0    Return if symptoms worsen or fail to improve.  Charlton Amor, DO

## 2021-10-10 NOTE — Assessment & Plan Note (Signed)
-   sounds viral in nature since pt had recent strep negative  - provided pt with symptomatic relief chlorseptic spray - rec salt water gargles, sleep - if no better please follow up in person with me for a visit

## 2021-10-17 ENCOUNTER — Telehealth: Payer: BC Managed Care – PPO | Admitting: Family Medicine

## 2022-03-15 ENCOUNTER — Encounter: Payer: Self-pay | Admitting: Family Medicine

## 2022-03-20 ENCOUNTER — Ambulatory Visit: Payer: BC Managed Care – PPO | Admitting: Family Medicine

## 2022-03-20 ENCOUNTER — Encounter: Payer: Self-pay | Admitting: Family Medicine

## 2022-03-20 VITALS — BP 136/74 | HR 57 | Ht 67.0 in | Wt 200.0 lb

## 2022-03-20 DIAGNOSIS — R109 Unspecified abdominal pain: Secondary | ICD-10-CM | POA: Diagnosis not present

## 2022-03-20 NOTE — Progress Notes (Signed)
Natalie Day - 42 y.o. female MRN [id number]  Date of birth: 1980/07/30  Subjective Chief Complaint  Patient presents with   Abdominal Pain    HPI Natalie Day is a 42 year old female here today with complaint of abdominal pain.  Symptoms started a couple weeks ago.  She felt cramping sensation in her lower abdominal pain.  Describes pain as cramping sensation almost like menstrual cramps.  She has not had a menstrual period in about 3 years as though she is postmenopausal.  She did not have any associated bleeding with the cramping sensation.  She did see GYN urgent care and had pelvic ultrasound as well as testing for BV and yeast.  Urinalysis was unremarkable.  Her ultrasound did show a possible intramural uterine fibroid as well as a left ovarian cystic lesion.  She did forward this to her GYN as well who felt this was unremarkable and unlikely to be the cause of her pain.  She denies any urinary frequency or urgency.  Bowels are moving normally.  She reports that pain has improved significantly over the past few days.  ROS:  A comprehensive ROS was completed and negative except as noted per HPI  No Known Allergies  Past Medical History:  Diagnosis Date   Abnormal Pap smear 2001   Dysplasia, Colpo/BX, LEEP   Amenorrhea    Chlamydia    GERD (gastroesophageal reflux disease)    HPV (human papilloma virus) infection    Infection    STD (sexually transmitted disease)    Trichimoniasis     Past Surgical History:  Procedure Laterality Date   BREAST BIOPSY Right 10/11/2020   CERVICAL BIOPSY  W/ LOOP ELECTRODE EXCISION  2001   CESAREAN SECTION     COLPOSCOPY     UPPER GASTROINTESTINAL ENDOSCOPY  15 years ago     Social History   Socioeconomic History   Marital status: Single    Spouse name: Not on file   Number of children: 1   Years of education: Masters   Highest education level: Not on file  Occupational History   Occupation: [employer]   Tobacco Use   Smoking status:  Never   Smokeless tobacco: Never  Vaping Use   Vaping Use: Never used  Substance and Sexual Activity   Alcohol use: No   Drug use: No   Sexual activity: Yes    Partners: Male    Birth control/protection: I.U.D.    Comment: Mirena  Other Topics Concern   Not on file  Social History Narrative   Lives alone   Caffeine use: soda/tea 3x/day   Right   Handed   Social Determinants of Health   Financial Resource Strain: Not on file  Food Insecurity: Not on file  Transportation Needs: Not on file  Physical Activity: Not on file  Stress: Not on file  Social Connections: Not on file    Family History  Problem Relation Age of Onset   Breast cancer Mother 64       Invasive Lobular Carcinoma   Healthy Father    Colon cancer Neg Hx    Pancreatic cancer Neg Hx    Esophageal cancer Neg Hx     Health Maintenance  Topic Date Due   INFLUENZA VACCINE  10/16/2021   COVID-19 Vaccine (5 - 2023-24 season) 11/16/2021   Hepatitis C Screening  09/26/2022 (Originally 04/17/1996)   PAP SMEAR-Modifier  11/22/2023   DTaP/Tdap/Td (3 - Td or Tdap) 09/07/2029   HIV Screening  Completed   HPV VACCINES  Aged Out     ----------------------------------------------------------------------------------------------------------------------------------------------------------------------------------------------------------------- Physical Exam BP 136/74   Pulse (!) 57   Ht 5\' 7"  (1.702 m)   Wt 200 lb (90.7 kg)   LMP 03/18/2006   SpO2 99%   BMI 31.32 kg/m   Physical Exam Constitutional:      Appearance: She is well-developed.  HENT:     Head: Normocephalic and atraumatic.  Cardiovascular:     Rate and Rhythm: Normal rate and regular rhythm.  Pulmonary:     Effort: Pulmonary effort is normal.     Breath sounds: Normal breath sounds.  Abdominal:     General: Abdomen is flat. There is no distension.     Palpations: Abdomen is soft. There is no mass.     Tenderness: There is no abdominal  tenderness. There is no right CVA tenderness, left CVA tenderness, guarding or rebound.  Musculoskeletal:     Cervical back: Neck supple.  Neurological:     Mental Status: She is alert.  Psychiatric:        Mood and Affect: Mood normal.        Behavior: Behavior normal.     ------------------------------------------------------------------------------------------------------------------------------------------------------------------------------------------------------------------- Assessment and Plan  Abdominal pain GYN does not seem to think this is related to a GYN problem.  Her symptoms do seem to be resolving at this point.  Differential includes diverticulitis, renal stone and/or constipation.  We discussed obtaining CT scan of the abdomen and pelvis if symptoms return or worsen.   No orders of the defined types were placed in this encounter.   No follow-ups on file.    This visit occurred during the SARS-CoV-2 public health emergency.  Safety protocols were in place, including screening questions prior to the visit, additional usage of staff PPE, and extensive cleaning of exam room while observing appropriate contact time as indicated for disinfecting solutions.

## 2022-03-20 NOTE — Assessment & Plan Note (Addendum)
GYN does not seem to think this is related to a GYN problem.  Her symptoms do seem to be resolving at this point.  Differential includes diverticulitis, renal stone and/or constipation.  We discussed obtaining CT scan of the abdomen and pelvis if symptoms return or worsen.

## 2022-06-17 IMAGING — MG MM DIGITAL DIAGNOSTIC UNILAT*R* W/ TOMO W/ CAD
4 series · 4 of 12 positions shown · non-contrast
Comparison: Previous exam(s).

CLINICAL DATA: 40-year-old female presenting for six-month
follow-up of a benign right breast biopsy demonstrating fibrocystic
changes.

EXAM:
DIGITAL DIAGNOSTIC UNILATERAL RIGHT MAMMOGRAM WITH TOMOSYNTHESIS AND
CAD
TECHNIQUE: Right digital diagnostic mammography and breast tomosynthesis was
performed. The images were evaluated with computer-aided detection.

[R MLO synth-2D]
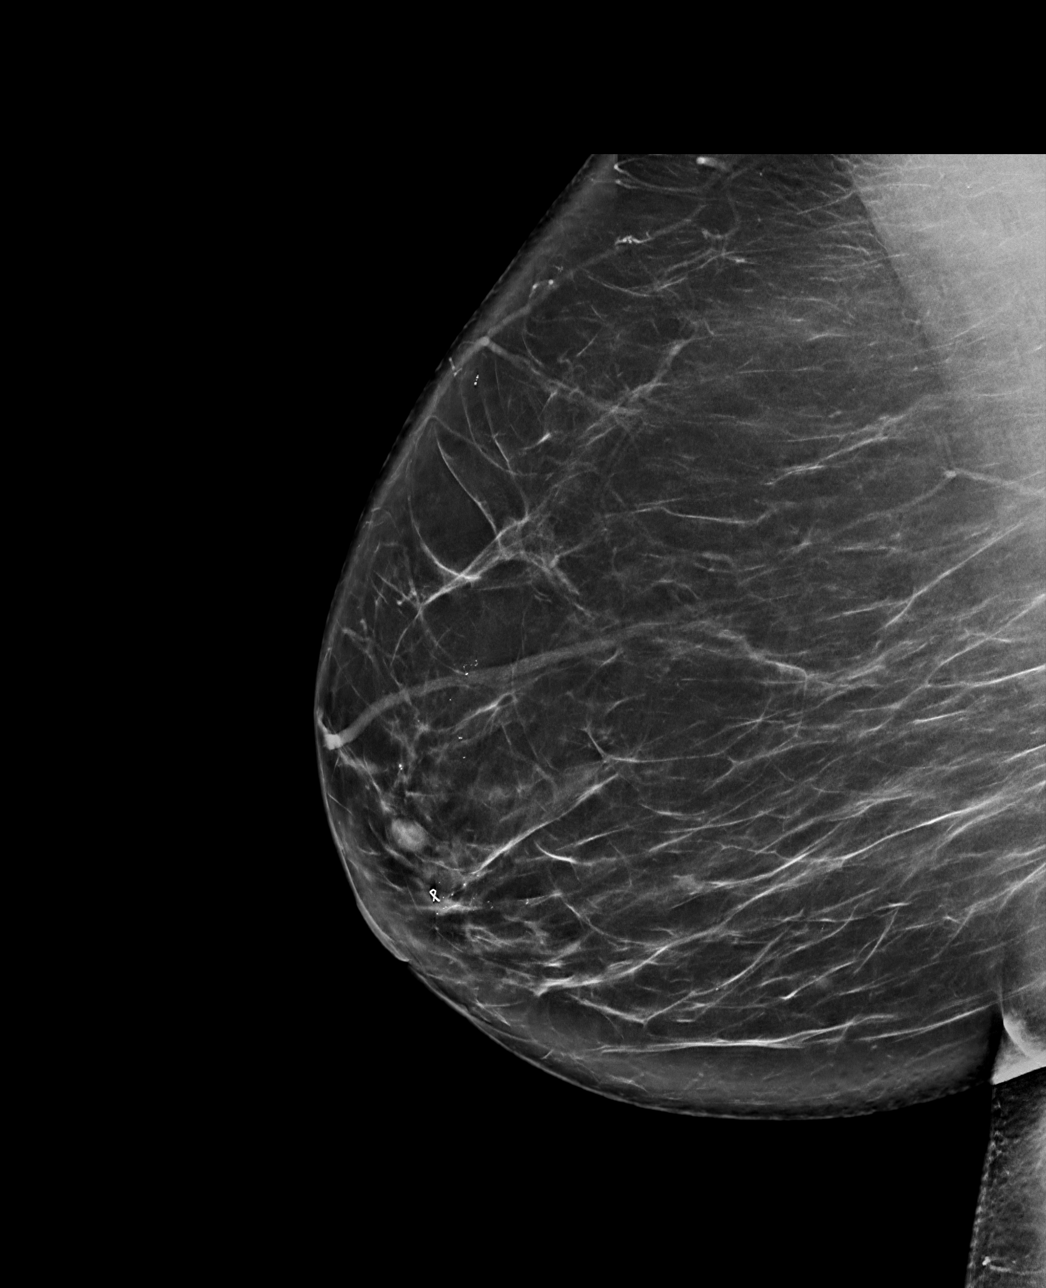

[R CC synth-2D]
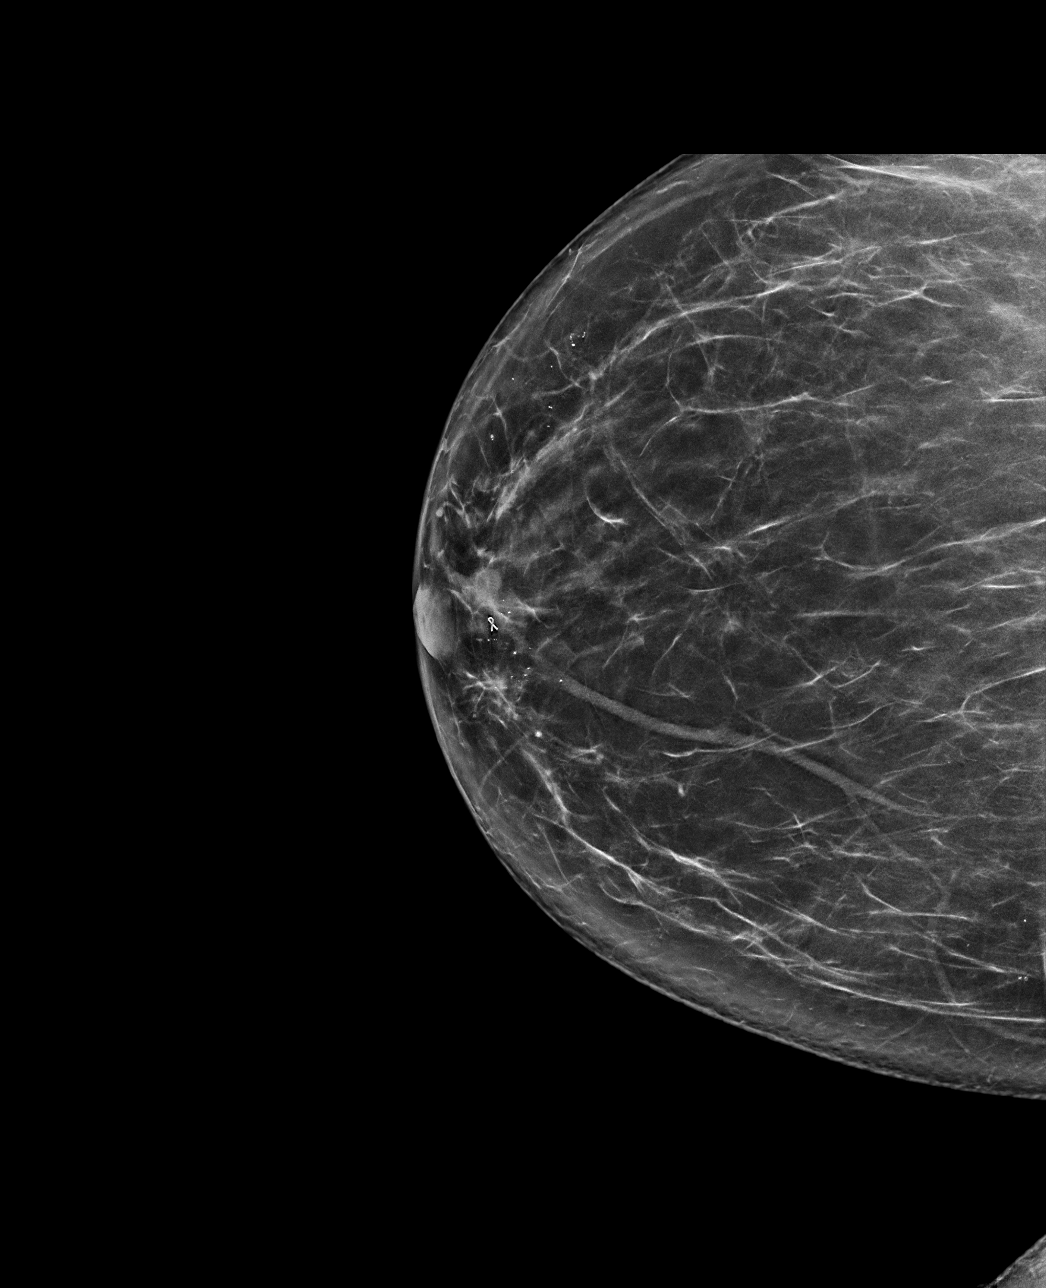

[R MLO tomo · tomo slice 47/93.0]
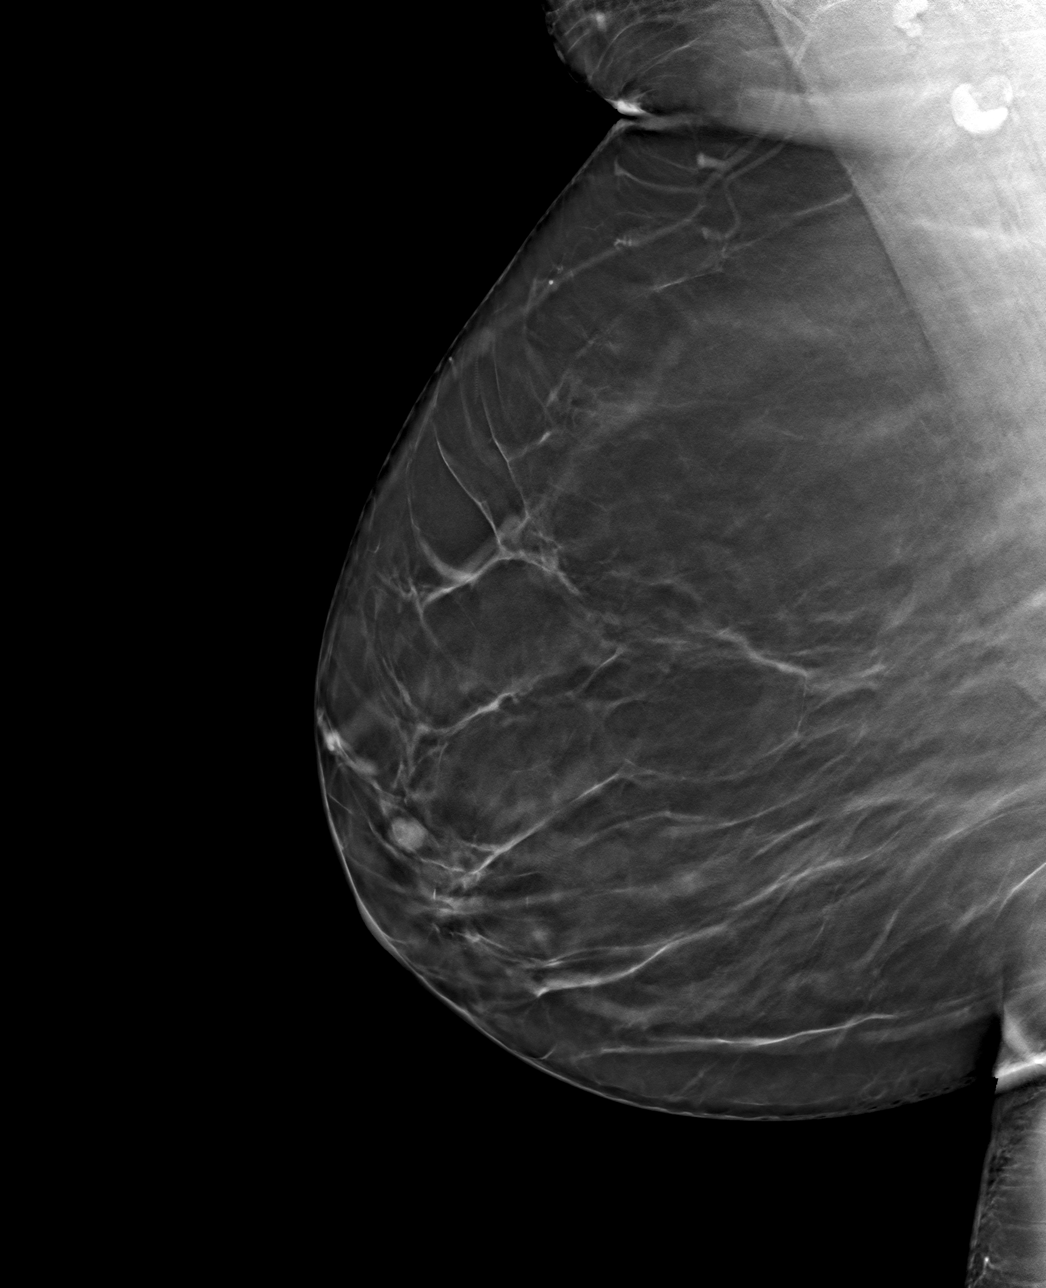

[R CC tomo · tomo slice 41/80.0]
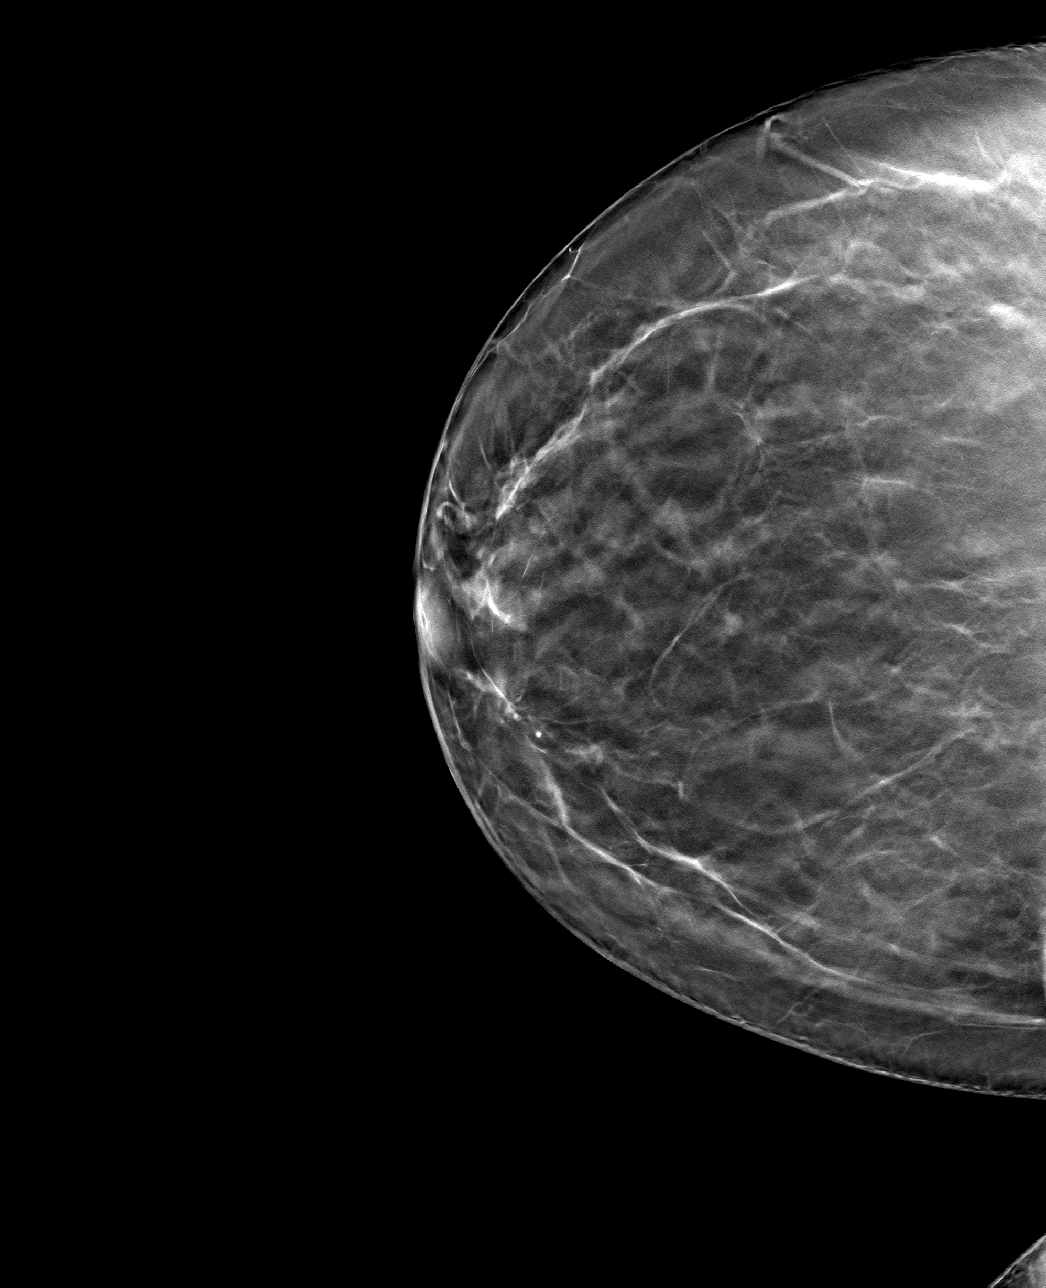

[4 of 12 positions shown; findings below may reference images not displayed]

ACR Breast Density Category b: There are scattered areas of
fibroglandular density.
FINDINGS: There is a ribbon biopsy marking clip in the retroareolar right
breast at the site of prior benign biopsy. The adjacent small mass
is not appear significantly changed. There are some new loosely
grouped punctate calcifications along the biopsy tract. There are no
additional new findings elsewhere in the right breast.
IMPRESSION: Stable probably benign right breast biopsy site. New loosely grouped
punctate calcifications in the anterior breast appear to be
associated with the biopsy tract, likely postprocedural.

RECOMMENDATION:
Diagnostic bilateral mammogram and possible right breast ultrasound
in 6 months.

I have discussed the findings and recommendations with the patient.
If applicable, a reminder letter will be sent to the patient
regarding the next appointment.

BI-RADS CATEGORY  3: Probably benign.

## 2022-06-20 ENCOUNTER — Encounter: Payer: Self-pay | Admitting: Family Medicine

## 2022-06-20 ENCOUNTER — Telehealth (INDEPENDENT_AMBULATORY_CARE_PROVIDER_SITE_OTHER): Payer: BC Managed Care – PPO | Admitting: Family Medicine

## 2022-06-20 VITALS — Ht 67.0 in | Wt 190.0 lb

## 2022-06-20 DIAGNOSIS — F411 Generalized anxiety disorder: Secondary | ICD-10-CM | POA: Diagnosis not present

## 2022-06-20 MED ORDER — HYDROXYZINE HCL 25 MG PO TABS: 12.5000 mg | ORAL_TABLET | Freq: Three times a day (TID) | ORAL | 0 refills | Status: DC | PRN

## 2022-06-20 NOTE — Assessment & Plan Note (Signed)
She notes increased stress with some anxiety.  Previously on Lexapro but does not feel that she needs a medication that she takes every day.  We discussed trying Vistaril and will start 12.5 to 25 mg every 8 hours as needed.  Potential side effects reviewed.  Discussed counseling through [facility] initially.  Follow-up in 6 to 8 weeks.

## 2022-06-20 NOTE — Progress Notes (Signed)
Natalie Day - 42 y.o. female MRN [id number]  Date of birth: 07-11-1980   This visit type was conducted due to national recommendations for restrictions regarding the COVID-19 Pandemic (e.g. social distancing).  This format is felt to be most appropriate for this patient at this time.  All issues noted in this document were discussed and addressed.  No physical exam was performed (except for noted visual exam findings with Video Visits).  I discussed the limitations of evaluation and management by telemedicine and the availability of in person appointments. The patient expressed understanding and agreed to proceed.  I connected withNAME@ on 06/20/22 at 10:30 AM EDT by a video enabled telemedicine application and verified that I am speaking with the correct person using two identifiers.  Present at visit: [name], DO Darrol Poke Meixner   Patient Location: Home [address] [place] [address]   Provider location:   [facility]  Chief Complaint  Patient presents with   Anxiety    HPI  Natalie Day is a 42 y.o. female who presents via audio/video conferencing for a telehealth visit today.  She reports having increased stress and some anxiety over the past several weeks.  Her sister recently had her gallbladder removed and has had complications related to this resulting in additional hospitalizations.  She has been having to help provide care for her.  She does have some other family members that she has also had and provide care for her.  Additionally, she is still having to provide care and transportation for her son's activities.   ROS:  A comprehensive ROS was completed and negative except as noted per HPI  Past Medical History:  Diagnosis Date   Abnormal Pap smear 2001   Dysplasia, Colpo/BX, LEEP   Amenorrhea    Chlamydia    GERD (gastroesophageal reflux disease)    HPV (human papilloma virus) infection    Infection    STD (sexually transmitted disease)     Trichimoniasis     Past Surgical History:  Procedure Laterality Date   BREAST BIOPSY Right 10/11/2020   CERVICAL BIOPSY  W/ LOOP ELECTRODE EXCISION  2001   CESAREAN SECTION     COLPOSCOPY     UPPER GASTROINTESTINAL ENDOSCOPY  15 years ago     Family History  Problem Relation Age of Onset   Breast cancer Mother 63       Invasive Lobular Carcinoma   Healthy Father    Colon cancer Neg Hx    Pancreatic cancer Neg Hx    Esophageal cancer Neg Hx     Social History   Socioeconomic History   Marital status: Single    Spouse name: Not on file   Number of children: 1   Years of education: Masters   Highest education level: Not on file  Occupational History   Occupation: [employer]   Tobacco Use   Smoking status: Never   Smokeless tobacco: Never  Vaping Use   Vaping Use: Never used  Substance and Sexual Activity   Alcohol use: No   Drug use: No   Sexual activity: Yes    Partners: Male    Birth control/protection: I.U.D.    Comment: Mirena  Other Topics Concern   Not on file  Social History Narrative   Lives alone   Caffeine use: soda/tea 3x/day   Right   Handed   Social Determinants of Health   Financial Resource Strain: Not on file  Food Insecurity: Not on file  Transportation Needs: Not on file  Physical Activity: Not on file  Stress: Not on file  Social Connections: Not on file  Intimate Partner Violence: Not on file     Current Outpatient Medications:    hydrOXYzine (ATARAX) 25 MG tablet, Take 0.5-1 tablets (12.5-25 mg total) by mouth every 8 (eight) hours as needed for anxiety., Disp: 30 tablet, Rfl: 0   pantoprazole (PROTONIX) 40 MG tablet, TAKE 1 TABLET (40 MG TOTAL) BY MOUTH TWICE A DAY BEFORE MEALS, Disp: 180 tablet, Rfl: 3   ibuprofen (ADVIL) 800 MG tablet, Take 1 tablet (800 mg total) by mouth 3 (three) times daily. (Patient not taking: Reported on 03/20/2022), Disp: 21 tablet, Rfl: 0  EXAM:  VITALS per patient if applicable: Ht 5\' 7"  (1.702 m)   Wt  190 lb (86.2 kg)   LMP 03/18/2006   BMI 29.76 kg/m   GENERAL: alert, oriented, appears well and in no acute distress  HEENT: atraumatic, conjunttiva clear, no obvious abnormalities on inspection of external nose and ears  NECK: normal movements of the head and neck  LUNGS: on inspection no signs of respiratory distress, breathing rate appears normal, no obvious gross SOB, gasping or wheezing  CV: no obvious cyanosis  MS: moves all visible extremities without noticeable abnormality  PSYCH/NEURO: pleasant and cooperative, no obvious depression or anxiety, speech and thought processing grossly intact  ASSESSMENT AND PLAN:  Discussed the following assessment and plan:  GAD (generalized anxiety disorder) She notes increased stress with some anxiety.  Previously on Lexapro but does not feel that she needs a medication that she takes every day.  We discussed trying Vistaril and will start 12.5 to 25 mg every 8 hours as needed.  Potential side effects reviewed.  Discussed counseling through [facility] initially.  Follow-up in 6 to 8 weeks.     I discussed the assessment and treatment plan with the patient. The patient was provided an opportunity to ask questions and all were answered. The patient agreed with the plan and demonstrated an understanding of the instructions.   The patient was advised to call back or seek an in-person evaluation if the symptoms worsen or if the condition fails to improve as anticipated.    [name], DO

## 2022-07-23 ENCOUNTER — Other Ambulatory Visit: Payer: Self-pay | Admitting: Obstetrics and Gynecology

## 2022-07-23 DIAGNOSIS — Z1231 Encounter for screening mammogram for malignant neoplasm of breast: Secondary | ICD-10-CM

## 2022-07-28 ENCOUNTER — Other Ambulatory Visit: Payer: Self-pay | Admitting: Family Medicine

## 2022-07-29 NOTE — Telephone Encounter (Signed)
Please contact patient to schedule follow-up appt for Anxiety with Dr. Ashley Royalty.

## 2022-07-30 NOTE — Telephone Encounter (Signed)
Called spoke with patient she said that she would call back to schedule an appointment due to closing on her house.

## 2022-09-13 ENCOUNTER — Encounter: Payer: Self-pay | Admitting: Family Medicine

## 2022-09-13 NOTE — Telephone Encounter (Signed)
Spoke with patient.  She c/o numbness/ tingling with some pain behind left knee that comes and goes x couple of weeks ( she states feels like her leg fell asleep). Denies swelling .  Patient scheduled for ZOXW9, 2024 with Dr. Tamera Punt.  Per DR. Metheney if swelling should appear or pain worsening then she should go to ER -otherwise o.k. to be seen on Monday.

## 2022-09-16 ENCOUNTER — Encounter: Payer: Self-pay | Admitting: Family Medicine

## 2022-09-16 ENCOUNTER — Ambulatory Visit
Admission: RE | Admit: 2022-09-16 | Discharge: 2022-09-16 | Disposition: A | Discharge status: Home / Self Care | Payer: BC Managed Care – PPO | Source: Ambulatory Visit | Attending: Obstetrics and Gynecology | Admitting: Obstetrics and Gynecology

## 2022-09-16 ENCOUNTER — Ambulatory Visit (INDEPENDENT_AMBULATORY_CARE_PROVIDER_SITE_OTHER): Payer: BC Managed Care – PPO

## 2022-09-16 ENCOUNTER — Ambulatory Visit (INDEPENDENT_AMBULATORY_CARE_PROVIDER_SITE_OTHER): Payer: BC Managed Care – PPO | Admitting: Family Medicine

## 2022-09-16 ENCOUNTER — Other Ambulatory Visit: Payer: Self-pay | Admitting: Family Medicine

## 2022-09-16 VITALS — BP 106/68 | HR 67 | Resp 18 | Ht 67.0 in | Wt 187.8 lb

## 2022-09-16 DIAGNOSIS — R200 Anesthesia of skin: Secondary | ICD-10-CM | POA: Diagnosis not present

## 2022-09-16 DIAGNOSIS — Z1231 Encounter for screening mammogram for malignant neoplasm of breast: Secondary | ICD-10-CM

## 2022-09-16 DIAGNOSIS — M25562 Pain in left knee: Secondary | ICD-10-CM

## 2022-09-16 DIAGNOSIS — M79672 Pain in left foot: Secondary | ICD-10-CM | POA: Insufficient documentation

## 2022-09-16 MED ORDER — PREDNISONE 20 MG PO TABS: 20.0000 mg | ORAL_TABLET | Freq: Two times a day (BID) | ORAL | 0 refills | Status: AC

## 2022-09-16 MED ORDER — GABAPENTIN 300 MG PO CAPS: 300.0000 mg | ORAL_CAPSULE | Freq: Two times a day (BID) | ORAL | 0 refills | Status: DC | PRN

## 2022-09-16 NOTE — Progress Notes (Signed)
Acute Office Visit  Subjective:     Patient ID: Natalie Day, female    DOB: 12/24/1980, 42 y.o.   MRN: [id number]  Chief Complaint  Patient presents with   Knee Pain    Knee Pain    Patient is in today for left knee pain for 3 weeks. It is causing her toes to be numb. Denies any trauma to her left knees. She says the numbness down to her toes has gone on and off for 3 weeks. She notes years of knee soreness.  Review of Systems  Constitutional:  Negative for chills and fever.  Respiratory:  Negative for cough and shortness of breath.   Cardiovascular:  Negative for chest pain.  Neurological:  Negative for headaches.        Objective:    BP 106/68 (BP Location: Right Leg, Patient Position: Sitting, Cuff Size: Large)   Pulse 67   Resp 18   Ht 5\' 7"  (1.702 m)   Wt 187 lb 12 oz (85.2 kg)   LMP 03/18/2006   SpO2 99%   BMI 29.41 kg/m    Physical Exam Vitals and nursing note reviewed.  Constitutional:      General: She is not in acute distress.    Appearance: Normal appearance.  HENT:     Head: Normocephalic and atraumatic.     Right Ear: External ear normal.     Left Ear: External ear normal.     Nose: Nose normal.  Eyes:     Conjunctiva/sclera: Conjunctivae normal.  Cardiovascular:     Rate and Rhythm: Normal rate and regular rhythm.  Pulmonary:     Effort: Pulmonary effort is normal.     Breath sounds: Normal breath sounds.  Musculoskeletal:     Comments: Tenderness to palpation of left great toe Crepitus on exam of left knee  Neurological:     General: No focal deficit present.     Mental Status: She is alert and oriented to person, place, and time.  Psychiatric:        Mood and Affect: Mood normal.        Behavior: Behavior normal.        Thought Content: Thought content normal.        Judgment: Judgment normal.     No results found for any visits on 09/16/22.      Assessment & Plan:   Problem List Items Addressed This Visit       Other    Left foot pain - Primary    Pt has pain and numbness of left foot. Will go ahead and do steroids and gabapentin to see if this helps pain that sounds like nerve pain      Relevant Orders   DG Foot Complete Left   Left leg numbness   Relevant Medications   predniSONE (DELTASONE) 20 MG tablet   Acute pain of left knee    - crepitus heard on exam pt has had a hx of left knee pain  - will get xray to evaluate for arthritis that is causing pain and pt would be eligible for knee injection      Relevant Orders   DG Knee Complete 4 Views Left    Meds ordered this encounter  Medications   predniSONE (DELTASONE) 20 MG tablet    Sig: Take 1 tablet (20 mg total) by mouth 2 (two) times daily with a meal for 5 days.    Dispense:  10 tablet  Refill:  0   gabapentin (NEURONTIN) 300 MG capsule    Sig: Take 1 capsule (300 mg total) by mouth 2 (two) times daily as needed (nerve pain).    Dispense:  30 capsule    Refill:  0      Charlton Amor, DO

## 2022-09-16 NOTE — Assessment & Plan Note (Addendum)
-   crepitus heard on exam pt has had a hx of left knee pain  - will get xray to evaluate for arthritis that is causing pain and pt would be eligible for knee injection

## 2022-09-16 NOTE — Assessment & Plan Note (Signed)
Pt has pain and numbness of left foot. Will go ahead and do steroids and gabapentin to see if this helps pain that sounds like nerve pain

## 2022-09-22 ENCOUNTER — Encounter: Payer: Self-pay | Admitting: Family Medicine

## 2022-09-25 ENCOUNTER — Ambulatory Visit (INDEPENDENT_AMBULATORY_CARE_PROVIDER_SITE_OTHER): Payer: BC Managed Care – PPO

## 2022-09-25 ENCOUNTER — Encounter: Payer: Self-pay | Admitting: Family Medicine

## 2022-09-25 ENCOUNTER — Ambulatory Visit (INDEPENDENT_AMBULATORY_CARE_PROVIDER_SITE_OTHER): Payer: BC Managed Care – PPO | Admitting: Family Medicine

## 2022-09-25 VITALS — BP 115/73 | HR 66 | Ht 67.0 in | Wt 190.0 lb

## 2022-09-25 DIAGNOSIS — M5432 Sciatica, left side: Secondary | ICD-10-CM | POA: Diagnosis not present

## 2022-09-25 MED ORDER — PREDNISONE 10 MG (48) PO TBPK: ORAL_TABLET | ORAL | 0 refills | Status: DC

## 2022-09-25 MED ORDER — GABAPENTIN 300 MG PO CAPS: 300.0000 mg | ORAL_CAPSULE | Freq: Two times a day (BID) | ORAL | 0 refills | Status: DC | PRN

## 2022-09-25 NOTE — Assessment & Plan Note (Signed)
Xrays of lumbar spine ordered.  Adding extended prednisone taper.  May increase gabapentin as tolerated and as needed to up to 600mg  TID.  Given HEP handout.  If not improving we can consider formal PT and/or NCV/EMG testing.

## 2022-09-25 NOTE — Progress Notes (Signed)
Natalie Day - 42 y.o. female MRN [id number]  Date of birth: 03/08/1981  Subjective Chief Complaint  Patient presents with   Knee Pain    HPI Natalie Day is a 42 y.o. female here today for follow up of knee pain.    Seen by Dr. Tamera Punt.  Xrays of the food and knee ordered show mild degenerative and arthritic changes. Started on prednisone and gabapentin.  Reports that symptoms have not really improved at this point.  She did get temporary improvement while taking prednisone but symptoms returned after completion. She denies weakness of the leg.   No bowel or bladder dysfunction.   ROS:  A comprehensive ROS was completed and negative except as noted per HPI  No Known Allergies  Past Medical History:  Diagnosis Date   Abnormal Pap smear 2001   Dysplasia, Colpo/BX, LEEP   Amenorrhea    Chlamydia    GERD (gastroesophageal reflux disease)    HPV (human papilloma virus) infection    Infection    STD (sexually transmitted disease)    Trichimoniasis     Past Surgical History:  Procedure Laterality Date   BREAST BIOPSY Right 10/11/2020   CERVICAL BIOPSY  W/ LOOP ELECTRODE EXCISION  2001   CESAREAN SECTION     COLPOSCOPY     UPPER GASTROINTESTINAL ENDOSCOPY  15 years ago     Social History   Socioeconomic History   Marital status: Single    Spouse name: Not on file   Number of children: 1   Years of education: Masters   Highest education level: Not on file  Occupational History   Occupation: [employer]   Tobacco Use   Smoking status: Never   Smokeless tobacco: Never  Vaping Use   Vaping Use: Never used  Substance and Sexual Activity   Alcohol use: No   Drug use: No   Sexual activity: Yes    Partners: Male    Birth control/protection: I.U.D.    Comment: Mirena  Other Topics Concern   Not on file  Social History Narrative   Lives alone   Caffeine use: soda/tea 3x/day   Right   Handed   Social Determinants of Health   Financial Resource Strain: Not on file   Food Insecurity: Not on file  Transportation Needs: Not on file  Physical Activity: Not on file  Stress: Not on file  Social Connections: Not on file    Family History  Problem Relation Age of Onset   Breast cancer Mother 79       Invasive Lobular Carcinoma   Healthy Father    Colon cancer Neg Hx    Pancreatic cancer Neg Hx    Esophageal cancer Neg Hx     Health Maintenance  Topic Date Due   COVID-19 Vaccine (5 - 2023-24 season) 11/16/2021   Hepatitis C Screening  09/26/2022 (Originally 04/17/1996)   INFLUENZA VACCINE  10/17/2022   PAP SMEAR-Modifier  11/22/2023   DTaP/Tdap/Td (3 - Td or Tdap) 09/07/2029   HIV Screening  Completed   HPV VACCINES  Aged Out     ----------------------------------------------------------------------------------------------------------------------------------------------------------------------------------------------------------------- Physical Exam BP 115/73 (BP Location: Left Arm, Patient Position: Sitting, Cuff Size: Normal)   Pulse 66   Ht 5\' 7"  (1.702 m)   Wt 190 lb (86.2 kg)   LMP 03/18/2006   SpO2 99%   BMI 29.76 kg/m   Physical Exam Constitutional:      Appearance: Normal appearance.  Eyes:     General: No scleral  icterus. Musculoskeletal:     Comments: ROM of hip, and lumbar spine is normal.  Mild pain with SLR on L.   Neurological:     General: No focal deficit present.     Mental Status: She is alert.     Motor: No weakness.  Psychiatric:        Mood and Affect: Mood normal.        Behavior: Behavior normal.     ------------------------------------------------------------------------------------------------------------------------------------------------------------------------------------------------------------------- Assessment and Plan  Left sided sciatica Xrays of lumbar spine ordered.  Adding extended prednisone taper.  May increase gabapentin as tolerated and as needed to up to 600mg  TID.  Given HEP handout.   If not improving we can consider formal PT and/or NCV/EMG testing.    Meds ordered this encounter  Medications   predniSONE (STERAPRED UNI-PAK 48 TAB) 10 MG (48) TBPK tablet    Sig: Taper as directed on packaging.  12 day taper    Dispense:  48 tablet    Refill:  0   gabapentin (NEURONTIN) 300 MG capsule    Sig: Take 1 capsule (300 mg total) by mouth 2 (two) times daily as needed (nerve pain). May increase to 600mg  TID if needed.    Dispense:  90 capsule    Refill:  0    No follow-ups on file.    This visit occurred during the SARS-CoV-2 public health emergency.  Safety protocols were in place, including screening questions prior to the visit, additional usage of staff PPE, and extensive cleaning of exam room while observing appropriate contact time as indicated for disinfecting solutions.

## 2022-09-25 NOTE — Patient Instructions (Signed)
Sciatica  Sciatica is pain, weakness, tingling, or loss of feeling (numbness) along the sciatic nerve. The sciatic nerve starts in the lower back and goes down the back of each leg. Sciatica usually affects one side of the body. Sciatica usually goes away on its own or with treatment. Sometimes, sciatica may come back. What are the causes? This condition happens when the sciatic nerve is pinched or has pressure put on it. This may be caused by: A disk in between the bones of the spine bulging out too far (herniated disk). Changes in the spinal disks due to aging. A condition that affects a muscle in the butt. Extra bone growth near the sciatic nerve. A break (fracture) of the area between your hip bones (pelvis). Pregnancy. Tumor. This is rare. What increases the risk? You are more likely to develop this condition if you: Play sports that put pressure or stress on the spine. Have poor strength and ease of movement (flexibility). Have had a back injury or back surgery. Sit for long periods of time. Do activities that involve bending or lifting over and over again. Are very overweight (obese). What are the signs or symptoms? Symptoms can vary from mild to very bad. They may include: Any of these problems in the lower back, leg, hip, or butt: Mild tingling, loss of feeling, or dull aches. A burning feeling. Sharp pains. Loss of feeling in the back of the calf or the sole of the foot. Leg weakness. Very bad back pain that makes it hard to move. These symptoms may get worse when you cough, sneeze, or laugh. They may also get worse when you sit or stand for long periods of time. How is this treated? This condition often gets better without any treatment. However, treatment may include: Changing or cutting back on physical activity when you have pain. Exercising, including strengthening and stretching. Putting ice or heat on the affected area. Shots of medicines to relieve pain and  swelling or to relax your muscles. Surgery. Follow these instructions at home: Medicines Take over-the-counter and prescription medicines only as told by your doctor. Ask your doctor if you should avoid driving or using machines while you are taking your medicine. Managing pain     If told, put ice on the affected area. To do this: Put ice in a plastic bag. Place a towel between your skin and the bag. Leave the ice on for 20 minutes, 2-3 times a day. If your skin turns bright red, take off the ice right away to prevent skin damage. The risk of skin damage is higher if you cannot feel pain, heat, or cold. If told, put heat on the affected area. Do this as often as told by your doctor. Use the heat source that your doctor tells you to use, such as a moist heat pack or a heating pad. Place a towel between your skin and the heat source. Leave the heat on for 20-30 minutes. If your skin turns bright red, take off the heat right away to prevent burns. The risk of burns is higher if you cannot feel pain, heat, or cold. Activity  Return to your normal activities when your doctor says that it is safe. Avoid activities that make your symptoms worse. Take short rests during the day. When you rest for a long time, do some physical activity or stretching between periods of rest. Avoid sitting for a long time without moving. Get up and move around at least one time each   hour. Do exercises and stretches as told by your doctor. Do not lift anything that is heavier than 10 lb (4.5 kg). Avoid lifting heavy things even when you do not have symptoms. Avoid lifting heavy things over and over. When you lift objects, always lift in a way that is safe for your body. To do this, you should: Bend your knees. Keep the object close to your body. Avoid twisting. General instructions Stay at a healthy weight. Wear comfortable shoes that support your feet. Avoid wearing high heels. Avoid sleeping on a mattress  that is too soft or too hard. You might have less pain if you sleep on a mattress that is firm enough to support your back. Contact a doctor if: Your pain is not controlled by medicine. Your pain does not get better. Your pain gets worse. Your pain lasts longer than 4 weeks. You lose weight without trying. Get help right away if: You cannot control when you pee (urinate) or poop (have a bowel movement). You have weakness in any of these areas and it gets worse: Lower back. The area between your hip bones. Butt. Legs. You have redness or swelling of your back. You have a burning feeling when you pee. Summary Sciatica is pain, weakness, tingling, or loss of feeling (numbness) along the sciatic nerve. This may include the lower back, legs, hips, and butt. This condition happens when the sciatic nerve is pinched or has pressure put on it. Treatment often includes rest, exercise, medicines, and putting ice or heat on the affected area. This information is not intended to replace advice given to you by your health care provider. Make sure you discuss any questions you have with your health care provider. Document Revised: 06/11/2021 Document Reviewed: 06/11/2021 Elsevier Patient Education  2024 Elsevier Inc.  

## 2022-09-29 ENCOUNTER — Encounter (INDEPENDENT_AMBULATORY_CARE_PROVIDER_SITE_OTHER): Payer: BC Managed Care – PPO | Admitting: Family Medicine

## 2022-09-29 DIAGNOSIS — U071 COVID-19: Secondary | ICD-10-CM | POA: Diagnosis not present

## 2022-09-30 ENCOUNTER — Encounter: Payer: Self-pay | Admitting: Family Medicine

## 2022-09-30 MED ORDER — NIRMATRELVIR/RITONAVIR (PAXLOVID)TABLET: 3.0000 | ORAL_TABLET | Freq: Two times a day (BID) | ORAL | 0 refills | Status: AC

## 2022-09-30 NOTE — Telephone Encounter (Signed)
 Please see the MyChart message reply(ies) for my assessment and plan.    This patient gave consent for this Medical Advice Message and is aware that it may result in a bill to their insurance company, as well as the possibility of receiving a bill for a co-payment or deductible. They are an established patient, but are not seeking medical advice exclusively about a problem treated during an in person or video visit in the last seven days. I did not recommend an in person or video visit within seven days of my reply.    I spent a total of 7 minutes cumulative time within 7 days through MyChart messaging.  Cody Matthews, DO   

## 2022-10-01 MED ORDER — BENZONATATE 200 MG PO CAPS: 200.0000 mg | ORAL_CAPSULE | Freq: Two times a day (BID) | ORAL | 0 refills | Status: DC | PRN

## 2022-10-01 NOTE — Addendum Note (Signed)
Addended by: Mammie Lorenzo on: 10/01/2022 08:19 AM   Modules accepted: Orders

## 2022-10-02 MED ORDER — HYDROCODONE BIT-HOMATROP MBR 5-1.5 MG/5ML PO SOLN: 5.0000 mL | Freq: Three times a day (TID) | ORAL | 0 refills | Status: DC | PRN

## 2022-10-02 NOTE — Addendum Note (Signed)
Addended by: Mammie Lorenzo on: 10/02/2022 07:58 AM   Modules accepted: Orders

## 2022-10-14 ENCOUNTER — Other Ambulatory Visit: Payer: Self-pay | Admitting: Family Medicine

## 2022-10-14 DIAGNOSIS — K2100 Gastro-esophageal reflux disease with esophagitis, without bleeding: Secondary | ICD-10-CM

## 2022-12-30 ENCOUNTER — Other Ambulatory Visit: Payer: Self-pay | Admitting: Family Medicine

## 2022-12-30 DIAGNOSIS — K2100 Gastro-esophageal reflux disease with esophagitis, without bleeding: Secondary | ICD-10-CM

## 2023-03-06 ENCOUNTER — Other Ambulatory Visit: Payer: Self-pay | Admitting: Family Medicine

## 2023-03-06 ENCOUNTER — Encounter (INDEPENDENT_AMBULATORY_CARE_PROVIDER_SITE_OTHER): Payer: BC Managed Care – PPO | Admitting: Family Medicine

## 2023-03-06 DIAGNOSIS — J069 Acute upper respiratory infection, unspecified: Secondary | ICD-10-CM | POA: Diagnosis not present

## 2023-03-06 MED ORDER — PROMETHAZINE-DM 6.25-15 MG/5ML PO SYRP: 5.0000 mL | ORAL_SOLUTION | Freq: Four times a day (QID) | ORAL | 0 refills | Status: DC | PRN

## 2023-03-06 MED ORDER — BENZONATATE 200 MG PO CAPS: 200.0000 mg | ORAL_CAPSULE | Freq: Two times a day (BID) | ORAL | 0 refills | Status: DC | PRN

## 2023-03-06 NOTE — Telephone Encounter (Signed)
Please see the MyChart message reply(ies) for my assessment and plan.    This patient gave consent for this Medical Advice Message and is aware that it may result in a bill to their insurance company, as well as the possibility of receiving a bill for a co-payment or deductible. They are an established patient, but are not seeking medical advice exclusively about a problem treated during an in person or video visit in the last seven days. I did not recommend an in person or video visit within seven days of my reply.    I spent a total of 6 minutes cumulative time within 7 days through MyChart messaging.  Chenay Nesmith, DO   

## 2023-03-07 ENCOUNTER — Telehealth: Payer: Self-pay

## 2023-03-07 NOTE — Telephone Encounter (Signed)
Spoke with patient.

## 2023-03-07 NOTE — Telephone Encounter (Signed)
Attempted call to patient to help in scheduling a visit for symptoms to be evaluated .  Left a voice mail message requesting a return call.

## 2023-03-07 NOTE — Telephone Encounter (Signed)
Patient had medication called in yesterday and feels that she is feeling somewhat better- she will be coming in the day after christmas for a physical -

## 2023-03-07 NOTE — Telephone Encounter (Signed)
Copied from CRM [id number]. Topic: Appointments - Appointment Scheduling >> Mar 07, 2023 12:54 PM Prudencio Pair wrote: Patient/patient representative is calling to schedule an appointment. Refer to attachments for appointment information. Patient returning phone call from [place]. Stated that she was left a VM to give her a call. Tried calling CAL line & no one answered. Please give patient a call back. CB#: [phone].

## 2023-03-07 NOTE — Telephone Encounter (Signed)
Copied from CRM [id number]. Topic: Clinical - Medical Advice >> Mar 06, 2023  4:00 PM Lars Mage H wrote: Reason for CRM: Patient is experiencing a sore throat, cough, slight headache and nasal drip. Patient sent a message via MyChart to Dr. Ashley Royalty requesting Tessalon pearls and a cough syrup if possible - Could one of Dr. Belva Chimes nurses give the patient a call?

## 2023-03-13 ENCOUNTER — Encounter: Payer: Self-pay | Admitting: Family Medicine

## 2023-03-13 ENCOUNTER — Ambulatory Visit (INDEPENDENT_AMBULATORY_CARE_PROVIDER_SITE_OTHER): Payer: BC Managed Care – PPO | Admitting: Family Medicine

## 2023-03-13 VITALS — BP 111/80 | HR 74 | Ht 67.0 in | Wt 197.8 lb

## 2023-03-13 DIAGNOSIS — Z0000 Encounter for general adult medical examination without abnormal findings: Secondary | ICD-10-CM | POA: Insufficient documentation

## 2023-03-13 DIAGNOSIS — Z13220 Encounter for screening for lipoid disorders: Secondary | ICD-10-CM

## 2023-03-13 NOTE — Assessment & Plan Note (Signed)
 Well adult Orders Placed This Encounter  Procedures   CMP14+EGFR   CBC with Differential/Platelet   Lipid Panel With LDL/HDL Ratio  Screenings:  per lab orders Immunizations:  UTD Anticipatory guidance/Risk factor reduction:  Recommendations per AVS.

## 2023-03-13 NOTE — Patient Instructions (Signed)
Preventive Care 40-44 Years Old, Female Preventive care refers to lifestyle choices and visits with your health care provider that can promote health and wellness. Preventive care visits are also called wellness exams. What can I expect for my preventive care visit? Counseling Your health care provider may ask you questions about your: Medical history, including: Past medical problems. Family medical history. Pregnancy history. Current health, including: Menstrual cycle. Method of birth control. Emotional well-being. Home life and relationship well-being. Sexual activity and sexual health. Lifestyle, including: Alcohol, nicotine or tobacco, and drug use. Access to firearms. Diet, exercise, and sleep habits. Work and work environment. Sunscreen use. Safety issues such as seatbelt and bike helmet use. Physical exam Your health care provider will check your: Height and weight. These may be used to calculate your BMI (body mass index). BMI is a measurement that tells if you are at a healthy weight. Waist circumference. This measures the distance around your waistline. This measurement also tells if you are at a healthy weight and may help predict your risk of certain diseases, such as type 2 diabetes and high blood pressure. Heart rate and blood pressure. Body temperature. Skin for abnormal spots. What immunizations do I need?  Vaccines are usually given at various ages, according to a schedule. Your health care provider will recommend vaccines for you based on your age, medical history, and lifestyle or other factors, such as travel or where you work. What tests do I need? Screening Your health care provider may recommend screening tests for certain conditions. This may include: Lipid and cholesterol levels. Diabetes screening. This is done by checking your blood sugar (glucose) after you have not eaten for a while (fasting). Pelvic exam and Pap test. Hepatitis B test. Hepatitis C  test. HIV (human immunodeficiency virus) test. STI (sexually transmitted infection) testing, if you are at risk. Lung cancer screening. Colorectal cancer screening. Mammogram. Talk with your health care provider about when you should start having regular mammograms. This may depend on whether you have a family history of breast cancer. BRCA-related cancer screening. This may be done if you have a family history of breast, ovarian, tubal, or peritoneal cancers. Bone density scan. This is done to screen for osteoporosis. Talk with your health care provider about your test results, treatment options, and if necessary, the need for more tests. Follow these instructions at home: Eating and drinking  Eat a diet that includes fresh fruits and vegetables, whole grains, lean protein, and low-fat dairy products. Take vitamin and mineral supplements as recommended by your health care provider. Do not drink alcohol if: Your health care provider tells you not to drink. You are pregnant, may be pregnant, or are planning to become pregnant. If you drink alcohol: Limit how much you have to 0-1 drink a day. Know how much alcohol is in your drink. In the [place] one drink equals one 12 oz bottle of beer (355 mL), one 5 oz glass of wine (148 mL), or one 1 oz glass of hard liquor (44 mL). Lifestyle Brush your teeth every morning and night with fluoride toothpaste. Floss one time each day. Exercise for at least 30 minutes 5 or more days each week. Do not use any products that contain nicotine or tobacco. These products include cigarettes, chewing tobacco, and vaping devices, such as e-cigarettes. If you need help quitting, ask your health care provider. Do not use drugs. If you are sexually active, practice safe sex. Use a condom or other form of protection to   prevent STIs. If you do not wish to become pregnant, use a form of birth control. If you plan to become pregnant, see your health care provider for a  prepregnancy visit. Take aspirin only as told by your health care provider. Make sure that you understand how much to take and what form to take. Work with your health care provider to find out whether it is safe and beneficial for you to take aspirin daily. Find healthy ways to manage stress, such as: Meditation, yoga, or listening to music. Journaling. Talking to a trusted person. Spending time with friends and family. Minimize exposure to UV radiation to reduce your risk of skin cancer. Safety Always wear your seat belt while driving or riding in a vehicle. Do not drive: If you have been drinking alcohol. Do not ride with someone who has been drinking. When you are tired or distracted. While texting. If you have been using any mind-altering substances or drugs. Wear a helmet and other protective equipment during sports activities. If you have firearms in your house, make sure you follow all gun safety procedures. Seek help if you have been physically or sexually abused. What's next? Visit your health care provider once a year for an annual wellness visit. Ask your health care provider how often you should have your eyes and teeth checked. Stay up to date on all vaccines. This information is not intended to replace advice given to you by your health care provider. Make sure you discuss any questions you have with your health care provider. Document Revised: 08/30/2020 Document Reviewed: 08/30/2020 Elsevier Patient Education  2024 Elsevier Inc.  

## 2023-03-13 NOTE — Progress Notes (Signed)
Natalie Day - 42 y.o. female MRN [id number]  Date of birth: 1980-03-26  Subjective Chief Complaint  Patient presents with   Annual Exam    HPI Natalie Day is a 42 y.o. female here today for annual exam.    She needs physical for completed for work as well.   She reports that she is doing pretty well at this time.   She stays moderately active.  She feels that diet is pretty good.   Non-smoker.  No EtOH at this time.   Review of Systems  Constitutional:  Negative for chills, fever, malaise/fatigue and weight loss.  HENT:  Negative for congestion, ear pain and sore throat.   Eyes:  Negative for blurred vision, double vision and pain.  Respiratory:  Negative for cough and shortness of breath.   Cardiovascular:  Negative for chest pain and palpitations.  Gastrointestinal:  Negative for abdominal pain, blood in stool, constipation, heartburn and nausea.  Genitourinary:  Negative for dysuria and urgency.  Musculoskeletal:  Negative for joint pain and myalgias.  Neurological:  Negative for dizziness and headaches.  Endo/Heme/Allergies:  Does not bruise/bleed easily.  Psychiatric/Behavioral:  Negative for depression. The patient is not nervous/anxious and does not have insomnia.     No Known Allergies  Past Medical History:  Diagnosis Date   Abnormal Pap smear 2001   Dysplasia, Colpo/BX, LEEP   Amenorrhea    Chlamydia    GERD (gastroesophageal reflux disease)    HPV (human papilloma virus) infection    Infection    STD (sexually transmitted disease)    Trichimoniasis     Past Surgical History:  Procedure Laterality Date   BREAST BIOPSY Right 10/11/2020   CERVICAL BIOPSY  W/ LOOP ELECTRODE EXCISION  2001   CESAREAN SECTION     COLPOSCOPY     UPPER GASTROINTESTINAL ENDOSCOPY  15 years ago     Social History   Socioeconomic History   Marital status: Single    Spouse name: Not on file   Number of children: 1   Years of education: Masters   Highest education  level: Not on file  Occupational History   Occupation:  a [employer]   Tobacco Use   Smoking status: Never   Smokeless tobacco: Never  Vaping Use   Vaping status: Never Used  Substance and Sexual Activity   Alcohol use: No   Drug use: No   Sexual activity: Yes    Partners: Male    Birth control/protection: I.U.D.    Comment: Mirena  Other Topics Concern   Not on file  Social History Narrative   Lives alone   Caffeine use: soda/tea 3x/day   Right   Handed   Social Drivers of Health   Financial Resource Strain: Not on file  Food Insecurity: No Food Insecurity (03/17/2022)   Received from [facility], [facility]   Hunger Vital Sign    Worried About Running Out of Food in the Last Year: Never true    Ran Out of Food in the Last Year: Never true  Transportation Needs: Not on file  Physical Activity: Not on file  Stress: Not on file  Social Connections: Unknown (07/23/2021)   Received from [facility], [facility]   Social Network    Social Network: Not on file    Family History  Problem Relation Age of Onset   Breast cancer Mother 72       Invasive Lobular Carcinoma   Healthy Father    Colon cancer  Neg Hx    Pancreatic cancer Neg Hx    Esophageal cancer Neg Hx     Health Maintenance  Topic Date Due   Hepatitis C Screening  Never done   COVID-19 Vaccine (5 - 2024-25 season) 11/17/2022   Cervical Cancer Screening (HPV/Pap Cotest)  11/22/2023   DTaP/Tdap/Td (3 - Td or Tdap) 09/07/2029   INFLUENZA VACCINE  Completed   HIV Screening  Completed   HPV VACCINES  Aged Out     ----------------------------------------------------------------------------------------------------------------------------------------------------------------------------------------------------------------- Physical Exam BP 111/80 (BP Location: Left Arm, Patient Position: Sitting, Cuff Size: Large)   Pulse 74   Ht 5\' 7"  (1.702 m)   Wt 197 lb 12 oz (89.7 kg)   LMP 03/18/2006   SpO2 97%    BMI 30.97 kg/m   Physical Exam Constitutional:      General: She is not in acute distress. HENT:     Head: Normocephalic and atraumatic.     Right Ear: Tympanic membrane and ear canal normal.     Left Ear: Tympanic membrane and ear canal normal.     Nose: Nose normal.  Eyes:     General: No scleral icterus.    Conjunctiva/sclera: Conjunctivae normal.  Neck:     Thyroid: No thyromegaly.  Cardiovascular:     Rate and Rhythm: Normal rate and regular rhythm.     Heart sounds: Normal heart sounds.  Pulmonary:     Effort: Pulmonary effort is normal.     Breath sounds: Normal breath sounds.  Abdominal:     General: Bowel sounds are normal. There is no distension.     Palpations: Abdomen is soft.     Tenderness: There is no abdominal tenderness. There is no guarding.  Musculoskeletal:        General: Normal range of motion.     Cervical back: Normal range of motion and neck supple.  Lymphadenopathy:     Cervical: No cervical adenopathy.  Skin:    General: Skin is warm and dry.     Findings: No rash.  Neurological:     General: No focal deficit present.     Mental Status: She is alert and oriented to person, place, and time.     Cranial Nerves: No cranial nerve deficit.     Coordination: Coordination normal.  Psychiatric:        Mood and Affect: Mood normal.        Behavior: Behavior normal.     ------------------------------------------------------------------------------------------------------------------------------------------------------------------------------------------------------------------- Assessment and Plan  Well adult exam Well adult Orders Placed This Encounter  Procedures   CMP14+EGFR   CBC with Differential/Platelet   Lipid Panel With LDL/HDL Ratio  Screenings: per lab orders Immunizations:  UTD Anticipatory guidance/Risk factor reduction:  Recommendations per AVS.    No orders of the defined types were placed in this encounter.   No  follow-ups on file.    This visit occurred during the SARS-CoV-2 public health emergency.  Safety protocols were in place, including screening questions prior to the visit, additional usage of staff PPE, and extensive cleaning of exam room while observing appropriate contact time as indicated for disinfecting solutions.

## 2023-03-14 LAB — CBC WITH DIFFERENTIAL/PLATELET
Basophils Absolute: 0.1 10*3/uL (ref 0.0–0.2)
Basos: 2 %
EOS (ABSOLUTE): 0.2 10*3/uL (ref 0.0–0.4)
Eos: 3 %
Hematocrit: 41.8 % (ref 34.0–46.6)
Hemoglobin: 13.7 g/dL (ref 11.1–15.9)
Immature Grans (Abs): 0 10*3/uL (ref 0.0–0.1)
Immature Granulocytes: 0 %
Lymphocytes Absolute: 2.4 10*3/uL (ref 0.7–3.1)
Lymphs: 41 %
MCH: 28.3 pg (ref 26.6–33.0)
MCHC: 32.8 g/dL (ref 31.5–35.7)
MCV: 86 fL (ref 79–97)
Monocytes Absolute: 0.4 10*3/uL (ref 0.1–0.9)
Monocytes: 7 %
Neutrophils Absolute: 2.8 10*3/uL (ref 1.4–7.0)
Neutrophils: 47 %
Platelets: 154 10*3/uL (ref 150–450)
RBC: 4.84 x10E6/uL (ref 3.77–5.28)
RDW: 13 % (ref 11.7–15.4)
WBC: 5.8 10*3/uL (ref 3.4–10.8)

## 2023-03-14 LAB — CMP14+EGFR
ALT: 19 [IU]/L (ref 0–32)
AST: 20 [IU]/L (ref 0–40)
Albumin: 4.5 g/dL (ref 3.9–4.9)
Alkaline Phosphatase: 88 [IU]/L (ref 44–121)
BUN/Creatinine Ratio: 14 (ref 9–23)
BUN: 12 mg/dL (ref 6–24)
Bilirubin Total: 0.3 mg/dL (ref 0.0–1.2)
CO2: 24 mmol/L (ref 20–29)
Calcium: 9.5 mg/dL (ref 8.7–10.2)
Chloride: 104 mmol/L (ref 96–106)
Creatinine, Ser: 0.87 mg/dL (ref 0.57–1.00)
Globulin, Total: 2.1 g/dL (ref 1.5–4.5)
Glucose: 106 mg/dL — ABNORMAL HIGH (ref 70–99)
Potassium: 4.2 mmol/L (ref 3.5–5.2)
Sodium: 142 mmol/L (ref 134–144)
Total Protein: 6.6 g/dL (ref 6.0–8.5)
eGFR: 84 mL/min/{1.73_m2} (ref >59)

## 2023-03-14 LAB — LIPID PANEL WITH LDL/HDL RATIO
Cholesterol, Total: 190 mg/dL (ref 100–199)
HDL: 46 mg/dL (ref >39)
LDL Chol Calc (NIH): 127 mg/dL — ABNORMAL HIGH (ref 0–99)
LDL/HDL Ratio: 2.8 {ratio} (ref 0.0–3.2)
Triglycerides: 95 mg/dL (ref 0–149)
VLDL Cholesterol Cal: 17 mg/dL (ref 5–40)

## 2023-04-25 ENCOUNTER — Other Ambulatory Visit: Payer: Self-pay | Admitting: Family Medicine

## 2023-04-25 DIAGNOSIS — Z1231 Encounter for screening mammogram for malignant neoplasm of breast: Secondary | ICD-10-CM

## 2023-05-06 ENCOUNTER — Encounter (HOSPITAL_COMMUNITY): Payer: Self-pay | Admitting: Emergency Medicine

## 2023-05-06 ENCOUNTER — Emergency Department (HOSPITAL_COMMUNITY): Payer: Self-pay

## 2023-05-06 ENCOUNTER — Other Ambulatory Visit: Payer: Self-pay

## 2023-05-06 ENCOUNTER — Emergency Department (HOSPITAL_COMMUNITY)
Admission: EM | Admit: 2023-05-06 | Discharge: 2023-05-06 | Discharge status: Against Medical Advice | Payer: 59 | Attending: Emergency Medicine | Admitting: Emergency Medicine

## 2023-05-06 DIAGNOSIS — Y92410 Unspecified street and highway as the place of occurrence of the external cause: Secondary | ICD-10-CM | POA: Insufficient documentation

## 2023-05-06 DIAGNOSIS — Z5321 Procedure and treatment not carried out due to patient leaving prior to being seen by health care provider: Secondary | ICD-10-CM | POA: Diagnosis not present

## 2023-05-06 DIAGNOSIS — S61411A Laceration without foreign body of right hand, initial encounter: Secondary | ICD-10-CM | POA: Insufficient documentation

## 2023-05-06 DIAGNOSIS — R0789 Other chest pain: Secondary | ICD-10-CM | POA: Diagnosis not present

## 2023-05-06 DIAGNOSIS — S6991XA Unspecified injury of right wrist, hand and finger(s), initial encounter: Secondary | ICD-10-CM | POA: Diagnosis present

## 2023-05-06 LAB — BASIC METABOLIC PANEL
Anion gap: 12 (ref 5–15)
BUN: 13 mg/dL (ref 6–20)
CO2: 23 mmol/L (ref 22–32)
Calcium: 9.5 mg/dL (ref 8.9–10.3)
Chloride: 103 mmol/L (ref 98–111)
Creatinine, Ser: 0.76 mg/dL (ref 0.44–1.00)
GFR, Estimated: >60 mL/min (ref >60)
Glucose, Bld: 107 mg/dL — ABNORMAL HIGH (ref 70–99)
Potassium: 4 mmol/L (ref 3.5–5.1)
Sodium: 138 mmol/L (ref 135–145)

## 2023-05-06 LAB — CBC
HCT: 41.4 % (ref 36.0–46.0)
Hemoglobin: 13.6 g/dL (ref 12.0–15.0)
MCH: 28.8 pg (ref 26.0–34.0)
MCHC: 32.9 g/dL (ref 30.0–36.0)
MCV: 87.5 fL (ref 80.0–100.0)
Platelets: 145 10*3/uL — ABNORMAL LOW (ref 150–400)
RBC: 4.73 MIL/uL (ref 3.87–5.11)
RDW: 13.4 % (ref 11.5–15.5)
WBC: 10.2 10*3/uL (ref 4.0–10.5)
nRBC: 0 % (ref 0.0–0.2)

## 2023-05-06 LAB — TYPE AND SCREEN
ABO/RH(D): B POS
Antibody Screen: NEGATIVE

## 2023-05-06 MED ORDER — IOHEXOL 350 MG/ML SOLN
50.0000 mL | Freq: Once | INTRAVENOUS | Status: AC | PRN
  Administered 2023-05-06: 50 mL via INTRAVENOUS
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
## 2023-05-06 NOTE — ED Notes (Signed)
Patient decided to leave AMA
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
## 2023-05-07 ENCOUNTER — Emergency Department (HOSPITAL_BASED_OUTPATIENT_CLINIC_OR_DEPARTMENT_OTHER): Payer: 59

## 2023-05-07 ENCOUNTER — Emergency Department (HOSPITAL_BASED_OUTPATIENT_CLINIC_OR_DEPARTMENT_OTHER)
Admission: EM | Admit: 2023-05-07 | Discharge: 2023-05-07 | Disposition: A | Discharge status: Home / Self Care | Payer: 59

## 2023-05-07 ENCOUNTER — Encounter (HOSPITAL_BASED_OUTPATIENT_CLINIC_OR_DEPARTMENT_OTHER): Payer: Self-pay

## 2023-05-07 DIAGNOSIS — S161XXA Strain of muscle, fascia and tendon at neck level, initial encounter: Secondary | ICD-10-CM | POA: Insufficient documentation

## 2023-05-07 DIAGNOSIS — S199XXA Unspecified injury of neck, initial encounter: Secondary | ICD-10-CM | POA: Diagnosis present

## 2023-05-07 DIAGNOSIS — M79641 Pain in right hand: Secondary | ICD-10-CM | POA: Diagnosis not present

## 2023-05-07 DIAGNOSIS — M25561 Pain in right knee: Secondary | ICD-10-CM | POA: Insufficient documentation

## 2023-05-07 DIAGNOSIS — S2020XA Contusion of thorax, unspecified, initial encounter: Secondary | ICD-10-CM | POA: Diagnosis not present

## 2023-05-07 DIAGNOSIS — M25562 Pain in left knee: Secondary | ICD-10-CM | POA: Diagnosis not present

## 2023-05-07 DIAGNOSIS — Y92410 Unspecified street and highway as the place of occurrence of the external cause: Secondary | ICD-10-CM | POA: Insufficient documentation

## 2023-05-07 MED ORDER — LIDOCAINE 4 % EX PTCH: 1.0000 | MEDICATED_PATCH | CUTANEOUS | 0 refills | Status: DC

## 2023-05-07 MED ORDER — METHOCARBAMOL 500 MG PO TABS: 500.0000 mg | ORAL_TABLET | Freq: Two times a day (BID) | ORAL | 0 refills | Status: DC | PRN

## 2023-05-07 MED ORDER — ACETAMINOPHEN 500 MG PO TABS
1000.0000 mg | ORAL_TABLET | Freq: Once | ORAL | Status: AC
  Administered 2023-05-07: 1000 mg via ORAL
  Filled 2023-05-07: qty 2

## 2023-05-07 MED ORDER — LIDOCAINE 5 % EX PTCH
1.0000 | MEDICATED_PATCH | Freq: Once | CUTANEOUS | Status: DC
  Administered 2023-05-07: 1 via TRANSDERMAL
  Filled 2023-05-07: qty 1

## 2023-05-07 NOTE — Discharge Instructions (Addendum)
You do not have fractures in your hand.  As discussed he may take over-the-counter Tylenol alternating with ibuprofen for pain.  We are also prescribed lidocaine patches and muscle relaxer.  Do not drive or operate heavy machinery while taking muscle relaxer.  Please follow-up with your primary doctor.  Return immediately develop sudden onset headache, vision changes, facial droop, worsening chest pain, shortness of breath, inability to eat or drink due to nausea and vomiting, abdominal pain or any new or worsening symptoms that are concerning to you.
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
## 2023-05-07 NOTE — ED Triage Notes (Signed)
Pt reports she was seen at [facility] last night for the same.

## 2023-05-12 ENCOUNTER — Telehealth: Payer: 59 | Admitting: Family Medicine

## 2023-05-12 DIAGNOSIS — M549 Dorsalgia, unspecified: Secondary | ICD-10-CM
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
## 2023-05-14 ENCOUNTER — Encounter: Payer: Self-pay | Admitting: Family Medicine

## 2023-05-14 ENCOUNTER — Ambulatory Visit (INDEPENDENT_AMBULATORY_CARE_PROVIDER_SITE_OTHER): Payer: 59 | Admitting: Family Medicine

## 2023-05-14 VITALS — BP 124/80 | HR 68 | Ht 67.0 in | Wt 194.0 lb

## 2023-05-14 DIAGNOSIS — M549 Dorsalgia, unspecified: Secondary | ICD-10-CM | POA: Diagnosis not present

## 2023-05-14 DIAGNOSIS — M545 Low back pain, unspecified: Secondary | ICD-10-CM

## 2023-05-14 MED ORDER — CYCLOBENZAPRINE HCL 10 MG PO TABS: 10.0000 mg | ORAL_TABLET | Freq: Three times a day (TID) | ORAL | 0 refills | Status: DC | PRN

## 2023-05-14 MED ORDER — TRAMADOL HCL 50 MG PO TABS: 50.0000 mg | ORAL_TABLET | Freq: Three times a day (TID) | ORAL | 0 refills | Status: AC | PRN

## 2023-05-14 NOTE — Assessment & Plan Note (Signed)
 Methocarbamol has not been very effective for her.  Changing to cyclobenzaprine.  Additionally will add tramadol short-term as needed.  Recommend continue icing to area.  Referral placed to physical therapy.
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
## 2023-05-15 ENCOUNTER — Inpatient Hospital Stay: Payer: 59 | Admitting: Family Medicine

## 2023-05-15 ENCOUNTER — Ambulatory Visit: Payer: 59 | Attending: Family Medicine | Admitting: Rehabilitative and Restorative Service Providers"

## 2023-05-15 ENCOUNTER — Encounter: Payer: Self-pay | Admitting: Rehabilitative and Restorative Service Providers"

## 2023-05-15 ENCOUNTER — Other Ambulatory Visit: Payer: Self-pay

## 2023-05-15 DIAGNOSIS — R29898 Other symptoms and signs involving the musculoskeletal system: Secondary | ICD-10-CM | POA: Diagnosis present

## 2023-05-15 DIAGNOSIS — M6281 Muscle weakness (generalized): Secondary | ICD-10-CM | POA: Insufficient documentation

## 2023-05-15 DIAGNOSIS — R293 Abnormal posture: Secondary | ICD-10-CM | POA: Insufficient documentation

## 2023-05-15 DIAGNOSIS — M545 Low back pain, unspecified: Secondary | ICD-10-CM | POA: Diagnosis present

## 2023-05-15 DIAGNOSIS — M549 Dorsalgia, unspecified: Secondary | ICD-10-CM | POA: Diagnosis not present

## 2023-05-15 DIAGNOSIS — M546 Pain in thoracic spine: Secondary | ICD-10-CM | POA: Insufficient documentation

## 2023-05-19 ENCOUNTER — Ambulatory Visit: Payer: 59 | Admitting: Rehabilitative and Restorative Service Providers"

## 2023-05-19 ENCOUNTER — Telehealth: Payer: 59 | Admitting: Family Medicine

## 2023-05-21 ENCOUNTER — Ambulatory Visit: Payer: 59 | Admitting: Rehabilitative and Restorative Service Providers"

## 2023-05-23 ENCOUNTER — Telehealth: Payer: Self-pay

## 2023-05-23 NOTE — Telephone Encounter (Signed)
 FMLA documentation completed by Dr. Ashley Royalty.   Front Office: Scan to chart and contact patient for document pick-up.   Thanks

## 2023-05-23 NOTE — Telephone Encounter (Signed)
 Called patient to let here know that FMLA paperwork was ready for pick-up and she stated that she no longer needs them, she has went back to work. tvt

## 2023-05-28 NOTE — Telephone Encounter (Signed)
 Unsure if patient was contacted more than once. Documentation had been faxed previously. Note placed when original given to [employer].

## 2023-06-18 ENCOUNTER — Encounter: Payer: Self-pay | Admitting: Allergy

## 2023-06-18 ENCOUNTER — Other Ambulatory Visit: Payer: Self-pay

## 2023-06-18 ENCOUNTER — Ambulatory Visit: Payer: Self-pay | Admitting: Allergy

## 2023-06-18 VITALS — BP 132/86 | HR 67 | Temp 98.1°F | Resp 16 | Ht 65.75 in | Wt 196.2 lb

## 2023-06-18 DIAGNOSIS — L503 Dermatographic urticaria: Secondary | ICD-10-CM | POA: Diagnosis not present

## 2023-06-18 DIAGNOSIS — L299 Pruritus, unspecified: Secondary | ICD-10-CM | POA: Diagnosis not present

## 2023-06-18 MED ORDER — FAMOTIDINE 20 MG PO TABS: 20.0000 mg | ORAL_TABLET | Freq: Two times a day (BID) | ORAL | 2 refills | Status: DC

## 2023-06-18 MED ORDER — CETIRIZINE HCL 10 MG PO CHEW: 10.0000 mg | CHEWABLE_TABLET | Freq: Two times a day (BID) | ORAL | 2 refills | Status: AC

## 2023-06-18 NOTE — Patient Instructions (Signed)
 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 No clear triggers identified. Current management includes Zyrtec, Benadryl, and hydroxyzine.  High-dose antihistamine regimen recommended. Potential escalation to Singulair or Xolair if symptoms persist. Workup planned for underlying causes. - Initiate high-dose antihistamine regimen with Zyrtec twice daily and Pepcid twice daily. - Order lab work to investigate potential underlying causes, including autoimmune issues, self-activating allergy cells, and environmental or food allergies. - Consider escalation to Singulair if symptoms persist despite high-dose antihistamine regimen. - Consider Xolair therapy if symptoms persist after Singulair, noting insurance requirements for prior treatment attempts. - Avoid hydroxyzine and Benadryl unless absolutely necessary due to sedative effects.  Follow-up in 3 months or sooner if needed

## 2023-06-18 NOTE — Progress Notes (Signed)
 New Patient Note  RE: Natalie Day MRN: [id number] DOB: 12-Jul-1980 Date of Office Visit: 06/18/2023  Primary care provider: Everrett Coombe, DO  Chief Complaint: Itching  History of present illness: Natalie Day is a 43 y.o. female presenting today for evaluation of pruritus. Discussed the use of AI scribe software for clinical note transcription with the patient, who gave verbal consent to proceed.  She has been experiencing generalized itching for at least six months, possibly longer. The itching occurs randomly, approximately every three to four days, and affects her entire body from head to feet. She describes the sensation as 'pins and needles' and notes that scratching can lead to red, raised rash, particularly on her legs. She has tried changing her laundry detergent without any improvement and has not introduced any new foods or medications. A previous consultation with a dermatologist did not reveal any cause of the symptoms, and no biopsy was performed. She was recommended to use a cream that she could get online but she never got this cream.   For symptom relief, she uses Zyrtec, Benadryl, or hydroxyzine, depending on availability. She also applies a mixture of hydrocortisone cream mixed into her moisturizer after showering, which provides temporary relief. Benadryl does cause sedative effects and hydroxyzine does not, which was initially prescribed for anxiety but is used for itching based on her sister's experience.  No preceding illness, no weight changes, bowel habit changes, or new lumps or bumps. Her sleep pattern and energy levels have remained consistent, although she reports being 'always tired.'  She lives with her son and sister, and her son has experienced similar itching issues.  She states he has a lots of different allergies including eczema and he has been treated with, which were treated with prednisone and a steroid cream.     Review of systems: 10pt ROS  negative unless noted above in HPI  Past medical history: Past Medical History:  Diagnosis Date   Abnormal Pap smear 2001   Dysplasia, Colpo/BX, LEEP   Amenorrhea    Chlamydia    GERD (gastroesophageal reflux disease)    HPV (human papilloma virus) infection    Infection    STD (sexually transmitted disease)    Trichimoniasis     Past surgical history: Past Surgical History:  Procedure Laterality Date   BREAST BIOPSY Right 10/11/2020   CERVICAL BIOPSY  W/ LOOP ELECTRODE EXCISION  2001   CESAREAN SECTION     COLPOSCOPY     UPPER GASTROINTESTINAL ENDOSCOPY  15 years ago     Family history:  Family History  Problem Relation Age of Onset   Breast cancer Mother 12       Invasive Lobular Carcinoma   Healthy Father    Colon cancer Neg Hx    Pancreatic cancer Neg Hx    Esophageal cancer Neg Hx    Allergic rhinitis Neg Hx    Angioedema Neg Hx    Asthma Neg Hx    Eczema Neg Hx    Urticaria Neg Hx     Social history: Lives in a home with carpeting in the bedroom with gas heating and central cooling.  Dog in the home.  There is no concern for water damage, mildew or roaches in the home.  She is a Dietitian.  She denies a smoking history.   Medication List: Current Outpatient Medications  Medication Sig Dispense Refill   cetirizine (ZYRTEC) 10 MG chewable tablet Chew 1 tablet (10 mg total) by mouth in  the morning and at bedtime. 60 tablet 2   famotidine (PEPCID) 20 MG tablet Take 1 tablet (20 mg total) by mouth 2 (two) times daily. 60 tablet 2   hydrOXYzine (ATARAX) 25 MG tablet TAKE 0.5-1 TABLETS (12.5-25 MG TOTAL) BY MOUTH EVERY 8 (EIGHT) HOURS AS NEEDED FOR ANXIETY. 30 tablet 0   pantoprazole (PROTONIX) 40 MG tablet TAKE 1 TABLET (40 MG TOTAL) BY MOUTH TWICE A DAY BEFORE MEALS 180 tablet 1   cyclobenzaprine (FLEXERIL) 10 MG tablet Take 1 tablet (10 mg total) by mouth 3 (three) times daily as needed for muscle spasms. (Patient not taking: Reported on 06/18/2023) 30  tablet 0   lidocaine 4 % Place 1 patch onto the skin daily. (Patient not taking: Reported on 06/18/2023) 10 patch 0   promethazine-dextromethorphan (PROMETHAZINE-DM) 6.25-15 MG/5ML syrup Take 5 mLs by mouth 4 (four) times daily as needed for cough (Maximum dose: 30mL in 24 hours). (Patient not taking: Reported on 05/14/2023) 118 mL 0   No current facility-administered medications for this visit.    Known medication allergies: No Known Allergies   Physical examination: Blood pressure 132/86, pulse 67, temperature 98.1 F (36.7 C), temperature source Temporal, resp. rate 16, height 5' 5.75" (1.67 m), weight 196 lb 3.2 oz (89 kg), last menstrual period 03/18/2006, SpO2 100%.  General: Alert, interactive, in no acute distress. HEENT: PERRLA, TMs pearly gray, turbinates non-edematous without discharge, post-pharynx non erythematous. Neck: Supple without lymphadenopathy. Lungs: Clear to auscultation without wheezing, rhonchi or rales. {no increased work of breathing. CV: Normal S1, S2 without murmurs. Abdomen: Nondistended, nontender. Skin: Warm and dry, without lesions or rashes. Extremities:  No clubbing, cyanosis or edema. Neuro:   Grossly intact.  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 No clear triggers identified. Current management includes Zyrtec, Benadryl, and hydroxyzine.  High-dose antihistamine regimen recommended. Potential escalation to Singulair or Xolair if symptoms persist. Workup planned for underlying causes. - Initiate high-dose antihistamine regimen with Zyrtec twice daily and Pepcid twice daily. - Order lab work to investigate potential underlying causes, including autoimmune issues, self-activating allergy cells, and environmental or food allergies. - Consider escalation to Singulair if symptoms persist despite high-dose antihistamine  regimen. - Consider Xolair therapy if symptoms persist after Singulair, noting insurance requirements for prior treatment attempts. - Avoid hydroxyzine and Benadryl unless absolutely necessary due to sedative effects.  Follow-up in 3 months or sooner if needed  I appreciate the opportunity to take part in Leesa's care. Please do not hesitate to contact me with questions.  Sincerely,   Margo Aye, MD Allergy/Immunology [facility]

## 2023-06-27 ENCOUNTER — Encounter: Payer: Self-pay | Admitting: Allergy

## 2023-06-27 LAB — ALLERGENS W/TOTAL IGE AREA 2
Alternaria Alternata IgE: <0.1 kU/L
Aspergillus Fumigatus IgE: <0.1 kU/L
Bermuda Grass IgE: <0.1 kU/L
Cat Dander IgE: <0.1 kU/L
Cedar, Mountain IgE: <0.1 kU/L
Cladosporium Herbarum IgE: <0.1 kU/L
Cockroach, German IgE: <0.1 kU/L
Common Silver Birch IgE: <0.1 kU/L
Cottonwood IgE: <0.1 kU/L
D Farinae IgE: <0.1 kU/L
D Pteronyssinus IgE: <0.1 kU/L
Dog Dander IgE: <0.1 kU/L
Elm, American IgE: <0.1 kU/L
Johnson Grass IgE: <0.1 kU/L
Maple/Box Elder IgE: <0.1 kU/L
Mouse Urine IgE: <0.1 kU/L
Oak, White IgE: 0.11 kU/L — AB
Pecan, Hickory IgE: <0.1 kU/L
Penicillium Chrysogen IgE: <0.1 kU/L
Pigweed, Rough IgE: <0.1 kU/L
Ragweed, Short IgE: 0.71 kU/L — AB
Sheep Sorrel IgE Qn: <0.1 kU/L
Timothy Grass IgE: <0.1 kU/L
White Mulberry IgE: <0.1 kU/L

## 2023-06-27 LAB — CBC WITH DIFFERENTIAL/PLATELET
Basophils Absolute: 0.1 10*3/uL (ref 0.0–0.2)
Basos: 2 %
EOS (ABSOLUTE): 0.1 10*3/uL (ref 0.0–0.4)
Eos: 2 %
Hematocrit: 42.7 % (ref 34.0–46.6)
Hemoglobin: 14 g/dL (ref 11.1–15.9)
Immature Grans (Abs): 0 10*3/uL (ref 0.0–0.1)
Immature Granulocytes: 0 %
Lymphocytes Absolute: 2.1 10*3/uL (ref 0.7–3.1)
Lymphs: 31 %
MCH: 29 pg (ref 26.6–33.0)
MCHC: 32.8 g/dL (ref 31.5–35.7)
MCV: 88 fL (ref 79–97)
Monocytes Absolute: 0.5 10*3/uL (ref 0.1–0.9)
Monocytes: 7 %
Neutrophils Absolute: 4 10*3/uL (ref 1.4–7.0)
Neutrophils: 58 %
Platelets: 144 10*3/uL — ABNORMAL LOW (ref 150–450)
RBC: 4.83 x10E6/uL (ref 3.77–5.28)
RDW: 12.9 % (ref 11.7–15.4)
WBC: 6.8 10*3/uL (ref 3.4–10.8)

## 2023-06-27 LAB — COMPREHENSIVE METABOLIC PANEL WITH GFR
ALT: 21 IU/L (ref 0–32)
AST: 20 IU/L (ref 0–40)
Albumin: 4.8 g/dL (ref 3.9–4.9)
Alkaline Phosphatase: 92 IU/L (ref 44–121)
BUN/Creatinine Ratio: 15 (ref 9–23)
BUN: 12 mg/dL (ref 6–24)
Bilirubin Total: 0.4 mg/dL (ref 0.0–1.2)
CO2: 22 mmol/L (ref 20–29)
Calcium: 9.6 mg/dL (ref 8.7–10.2)
Chloride: 104 mmol/L (ref 96–106)
Creatinine, Ser: 0.81 mg/dL (ref 0.57–1.00)
Globulin, Total: 2.1 g/dL (ref 1.5–4.5)
Glucose: 80 mg/dL (ref 70–99)
Potassium: 4.5 mmol/L (ref 3.5–5.2)
Sodium: 142 mmol/L (ref 134–144)
Total Protein: 6.9 g/dL (ref 6.0–8.5)
eGFR: 91 mL/min/{1.73_m2} (ref >59)

## 2023-06-27 LAB — ALPHA-GAL PANEL
Allergen Lamb IgE: <0.1 kU/L
Beef IgE: <0.1 kU/L
IgE (Immunoglobulin E), Serum: 30 [IU]/mL (ref 6–495)
O215-IgE Alpha-Gal: <0.1 kU/L
Pork IgE: <0.1 kU/L

## 2023-06-27 LAB — THYROID ANTIBODIES (THYROPEROXIDASE & THYROGLOBULIN)
Thyroglobulin Antibody: <1 [IU]/mL (ref 0.0–0.9)
Thyroperoxidase Ab SerPl-aCnc: 16 [IU]/mL (ref 0–34)

## 2023-06-27 LAB — TRYPTASE: Tryptase: 4.7 ug/L (ref 2.2–13.2)

## 2023-06-27 LAB — SEDIMENTATION RATE: Sed Rate: 3 mm/h (ref 0–32)

## 2023-06-27 LAB — CHRONIC URTICARIA: cu index: 5 (ref <10)

## 2023-06-27 LAB — ANA W/REFLEX: Anti Nuclear Antibody (ANA): NEGATIVE

## 2023-08-21 ENCOUNTER — Encounter: Payer: Self-pay | Admitting: Allergy

## 2023-08-21 NOTE — Telephone Encounter (Signed)
 I called the patient to schedule a follow up. I informed the next opening for an OV with Dr.Padgett is until August at the moment. I informed the patient I could put her in with another provider before then but she wants to see Dr.Padgett and said she would callback to schedule.

## 2023-09-07 ENCOUNTER — Other Ambulatory Visit: Payer: Self-pay | Admitting: Family Medicine

## 2023-09-07 DIAGNOSIS — K2100 Gastro-esophageal reflux disease with esophagitis, without bleeding: Secondary | ICD-10-CM

## 2023-09-24 ENCOUNTER — Ambulatory Visit
Admission: RE | Admit: 2023-09-24 | Discharge: 2023-09-24 | Disposition: A | Discharge status: Home / Self Care | Payer: Self-pay | Source: Ambulatory Visit | Attending: Family Medicine | Admitting: Family Medicine

## 2023-09-24 DIAGNOSIS — Z1231 Encounter for screening mammogram for malignant neoplasm of breast: Secondary | ICD-10-CM

## 2023-09-26 ENCOUNTER — Ambulatory Visit: Payer: Self-pay

## 2023-09-26 NOTE — Telephone Encounter (Signed)
 FYI Only or Action Required?: Action required by provider: request for appointment.  Patient was last seen in primary care on 05/14/2023 by Alvia Bring, DO.  Called Nurse Triage reporting Depression.  Symptoms began several weeks ago.  Interventions attempted: Nothing.  Symptoms are: gradually worsening. Pt. Reports depression, anxiety. Lost brother to suicide recently. Work is overwhelming. Denies thoughts of self-harm. No availability with PCP until end of month. Appointment made.  Triage Disposition: See Physician Within 24 Hours  Patient/caregiver understands and will follow disposition?: Yes      Copied from CRM [id number]. Topic: Clinical - Red Word Triage >> Sep 26, 2023  2:21 PM Fredrica W wrote: Red Word that prompted transfer to Nurse Triage: Mental - Overwhelmed, Stress, depression, and anxiety - needs fmla completed. Brother committed suicide dealing with that. Reason for Disposition  [1] Depression AND [2] getting worse (e.g., sleeping poorly, less able to do activities of daily living)  Answer Assessment - Initial Assessment Questions 1. CONCERN: What happened that made you call today?     Depression 2. DEPRESSION SYMPTOM SCREENING: How are you feeling overall? (e.g., decreased energy, increased sleeping or difficulty sleeping, difficulty concentrating, feelings of sadness, guilt, hopelessness, or worthlessness)     Sad, not sleeping 3. RISK OF HARM - SUICIDAL IDEATION:  Do you ever have thoughts of hurting or killing yourself?  (e.g., yes, no, no but preoccupation with thoughts about death)     no 4. RISK OF HARM - HOMICIDAL IDEATION:  Do you ever have thoughts of hurting or killing someone else?  (e.g., yes, no, no but preoccupation with thoughts about death)     no 5. FUNCTIONAL IMPAIRMENT: How have things been going for you overall? Have you had more difficulty than usual doing your normal daily activities?  (e.g., better, same, worse; self-care, school,  work, interactions)     worse 6. SUPPORT: Who is with you now? Who do you live with? Do you have family or friends who you can talk to?      Yes 7. THERAPIST: Do you have a counselor or therapist? If Yes, ask: What is their name?     no 8. STRESSORS: Has there been any new stress or recent changes in your life?     Yes 9. ALCOHOL USE OR SUBSTANCE USE (DRUG USE): Do you drink alcohol or use any illegal drugs?     no 10. OTHER: Do you have any other physical symptoms right now? (e.g., fever)       no 11. PREGNANCY: Is there any chance you are pregnant? When was your last menstrual period?       no  Protocols used: Depression-A-AH

## 2023-09-29 ENCOUNTER — Encounter: Payer: Self-pay | Admitting: Physician Assistant

## 2023-09-29 ENCOUNTER — Ambulatory Visit: Admitting: Physician Assistant

## 2023-09-29 VITALS — BP 138/84 | HR 87 | Ht 65.0 in | Wt 197.0 lb

## 2023-09-29 DIAGNOSIS — F4321 Adjustment disorder with depressed mood: Secondary | ICD-10-CM | POA: Insufficient documentation

## 2023-09-29 DIAGNOSIS — F4323 Adjustment disorder with mixed anxiety and depressed mood: Secondary | ICD-10-CM | POA: Diagnosis not present

## 2023-09-29 MED ORDER — TRAZODONE HCL 50 MG PO TABS: 25.0000 mg | ORAL_TABLET | Freq: Every evening | ORAL | 1 refills | Status: AC | PRN

## 2023-09-29 MED ORDER — SERTRALINE HCL 25 MG PO TABS: 25.0000 mg | ORAL_TABLET | Freq: Every day | ORAL | 1 refills | Status: AC
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
## 2023-09-29 NOTE — Patient Instructions (Addendum)
 Trazodone  for sleep before bed.  Zoloft  before bed for anxiety/depression. Referral for counseling.  Written out of work for next 4 weeks.   Managing Loss, Adult People experience loss in many different ways throughout their lives. Events such as moving, changing jobs, and losing friends can create a sense of loss. The loss may be as serious as a major health change, divorce, death of a pet, or death of a loved one. All of these types of loss are likely to create a physical and emotional reaction known as grief. Grief is the result of a major change or an absence of something or someone that you count on. Grief is a normal reaction to loss. A variety of factors can affect your grieving experience, including: The nature of your loss. Your relationship to what or whom you lost. Your understanding of grief and how to manage it. Your support system. Be aware that when grief becomes extreme, it can lead to more severe issues like isolation, depression, anxiety, or suicidal thoughts. Talk with your health care provider if you have any of these issues. How to manage lifestyle changes Keep to your normal routine as much as possible. If you have trouble focusing or doing normal activities, it is acceptable to take some time away from your normal routine. Spend time with friends and loved ones. Eat a healthy diet, get plenty of sleep, and rest when you feel tired. How to recognize changes  The way that you deal with your grief will affect your ability to function as you normally do. When grieving, you may experience these changes: Numbness, shock, sadness, anxiety, anger, denial, and guilt. Thoughts about death. Unexpected crying. A physical sensation of emptiness in your stomach. Problems sleeping and eating. Tiredness (fatigue). Loss of interest in normal activities. Dreaming about or imagining seeing the person who died. A need to remember what or whom you lost. Difficulty thinking about  anything other than your loss for a period of time. Relief. If you have been expecting the loss for a while, you may feel a sense of relief when it happens. Follow these instructions at home: Activity Express your feelings in healthy ways, such as: Talking with others about your loss. It may be helpful to find others who have had a similar loss, such as a support group. Writing down your feelings in a journal. Doing physical activities to release stress and emotional energy. Doing creative activities like painting, sculpting, or playing or listening to music. Practicing resilience. This is the ability to recover and adjust after facing challenges. Reading some resources that encourage resilience may help you to learn ways to practice those behaviors.  General instructions Be patient with yourself and others. Allow the grieving process to happen, and remember that grieving takes time. It is likely that you may never feel completely done with some grief. You may find a way to move on while still cherishing memories and feelings about your loss. Accepting your loss is a process. It can take months or longer to adjust. Keep all follow-up visits. This is important. Where to find support To get support for managing loss: Ask your health care provider for help and recommendations, such as grief counseling or therapy. Think about joining a support group for people who are managing a loss. Where to find more information You can find more information about managing loss from: American Society of Clinical Oncology: www.cancer.net American Psychological Association: [employer] Contact a health care provider if: Your grief is extreme and  keeps getting worse. You have ongoing grief that does not improve. Your body shows symptoms of grief, such as illness. You feel depressed, anxious, or hopeless. Get help right away if: You have thoughts about hurting yourself or others. Get help right away if you  feel like you may hurt yourself or others, or have thoughts about taking your own life. Go to your nearest emergency room or: Call 911. Call the National Suicide Prevention Lifeline at (559) 583-9218 or 988. This is open 24 hours a day. Text the Crisis Text Line at 601-512-0022. Summary Grief is the result of a major change or an absence of someone or something that you count on. Grief is a normal reaction to loss. The depth of grief and the period of recovery depend on the type of loss and your ability to adjust to the change and process your feelings. Processing grief requires patience and a willingness to accept your feelings and talk about your loss with people who are supportive. It is important to find resources that work for you and to realize that people experience grief differently. There is not one grieving process that works for everyone in the same way. Be aware that when grief becomes extreme, it can lead to more severe issues like isolation, depression, anxiety, or suicidal thoughts. Talk with your health care provider if you have any of these issues. This information is not intended to replace advice given to you by your health care provider. Make sure you discuss any questions you have with your health care provider. Document Revised: 10/23/2020 Document Reviewed: 10/23/2020 Elsevier Patient Education  2024 [employer].

## 2023-09-30 ENCOUNTER — Telehealth: Payer: Self-pay | Admitting: Family Medicine

## 2023-09-30 NOTE — Telephone Encounter (Signed)
 Patient dropped off document FMLA, to be filled out by provider. Patient requested to send it back via Call Patient to pick up within ASAP. Document is located in providers tray at front office.Please advise at [facility] [phone]

## 2023-10-01 ENCOUNTER — Ambulatory Visit: Payer: Self-pay | Admitting: Family Medicine

## 2023-10-01 NOTE — Progress Notes (Signed)
 Please call patient. Normal mammogram.  Repeat in 1 year.

## 2023-10-09 ENCOUNTER — Ambulatory Visit: Admitting: Family Medicine
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
## 2023-10-23 ENCOUNTER — Ambulatory Visit (INDEPENDENT_AMBULATORY_CARE_PROVIDER_SITE_OTHER): Admitting: Medical-Surgical

## 2023-10-23 ENCOUNTER — Ambulatory Visit: Admitting: Family Medicine

## 2023-10-23 ENCOUNTER — Encounter: Payer: Self-pay | Admitting: Medical-Surgical

## 2023-10-23 VITALS — BP 107/73 | HR 70 | Resp 20 | Ht 65.0 in | Wt 202.8 lb

## 2023-10-23 DIAGNOSIS — Z0289 Encounter for other administrative examinations: Secondary | ICD-10-CM

## 2023-10-23 DIAGNOSIS — F4323 Adjustment disorder with mixed anxiety and depressed mood: Secondary | ICD-10-CM

## 2023-10-30 ENCOUNTER — Emergency Department (HOSPITAL_BASED_OUTPATIENT_CLINIC_OR_DEPARTMENT_OTHER)
Admission: EM | Admit: 2023-10-30 | Discharge: 2023-10-30 | Disposition: A | Discharge status: Home / Self Care | Attending: Emergency Medicine | Admitting: Emergency Medicine

## 2023-10-30 ENCOUNTER — Other Ambulatory Visit: Payer: Self-pay

## 2023-10-30 ENCOUNTER — Encounter (HOSPITAL_BASED_OUTPATIENT_CLINIC_OR_DEPARTMENT_OTHER): Payer: Self-pay | Admitting: Emergency Medicine

## 2023-10-30 ENCOUNTER — Ambulatory Visit: Payer: Self-pay | Admitting: *Deleted

## 2023-10-30 DIAGNOSIS — Z8616 Personal history of COVID-19: Secondary | ICD-10-CM | POA: Diagnosis not present

## 2023-10-30 DIAGNOSIS — R7309 Other abnormal glucose: Secondary | ICD-10-CM | POA: Insufficient documentation

## 2023-10-30 DIAGNOSIS — R519 Headache, unspecified: Secondary | ICD-10-CM | POA: Diagnosis present

## 2023-10-30 LAB — CBC
HCT: 38.3 % (ref 36.0–46.0)
Hemoglobin: 12.8 g/dL (ref 12.0–15.0)
MCH: 28.6 pg (ref 26.0–34.0)
MCHC: 33.4 g/dL (ref 30.0–36.0)
MCV: 85.7 fL (ref 80.0–100.0)
Platelets: 136 K/uL — ABNORMAL LOW (ref 150–400)
RBC: 4.47 MIL/uL (ref 3.87–5.11)
RDW: 13.4 % (ref 11.5–15.5)
WBC: 7.2 K/uL (ref 4.0–10.5)
nRBC: 0 % (ref 0.0–0.2)

## 2023-10-30 LAB — COMPREHENSIVE METABOLIC PANEL WITH GFR
ALT: 18 U/L (ref 0–44)
AST: 24 U/L (ref 15–41)
Albumin: 4.5 g/dL (ref 3.5–5.0)
Alkaline Phosphatase: 76 U/L (ref 38–126)
Anion gap: 11 (ref 5–15)
BUN: 9 mg/dL (ref 6–20)
CO2: 25 mmol/L (ref 22–32)
Calcium: 9.6 mg/dL (ref 8.9–10.3)
Chloride: 107 mmol/L (ref 98–111)
Creatinine, Ser: 0.74 mg/dL (ref 0.44–1.00)
GFR, Estimated: >60 mL/min (ref >60)
Glucose, Bld: 106 mg/dL — ABNORMAL HIGH (ref 70–99)
Potassium: 3.6 mmol/L (ref 3.5–5.1)
Sodium: 142 mmol/L (ref 135–145)
Total Bilirubin: 0.3 mg/dL (ref 0.0–1.2)
Total Protein: 6.8 g/dL (ref 6.5–8.1)

## 2023-10-30 LAB — CBG MONITORING, ED: Glucose-Capillary: 114 mg/dL — ABNORMAL HIGH (ref 70–99)

## 2023-10-30 MED ORDER — SODIUM CHLORIDE 0.9 % IV BOLUS
1000.0000 mL | Freq: Once | INTRAVENOUS | Status: AC
  Administered 2023-10-30: 1000 mL via INTRAVENOUS

## 2023-10-30 MED ORDER — DIPHENHYDRAMINE HCL 50 MG/ML IJ SOLN
12.5000 mg | Freq: Once | INTRAMUSCULAR | Status: AC
  Administered 2023-10-30: 12.5 mg via INTRAVENOUS
  Filled 2023-10-30: qty 1

## 2023-10-30 MED ORDER — PROCHLORPERAZINE EDISYLATE 10 MG/2ML IJ SOLN
10.0000 mg | Freq: Once | INTRAMUSCULAR | Status: AC
  Administered 2023-10-30: 10 mg via INTRAVENOUS
  Filled 2023-10-30: qty 2

## 2023-10-30 MED ORDER — KETOROLAC TROMETHAMINE 15 MG/ML IJ SOLN
15.0000 mg | Freq: Once | INTRAMUSCULAR | Status: AC
  Administered 2023-10-30: 15 mg via INTRAVENOUS
  Filled 2023-10-30: qty 1

## 2023-10-30 NOTE — Telephone Encounter (Signed)
 Patient scheduled for 11/03/2023 with Zada Willo PIETY currently.

## 2023-10-30 NOTE — Telephone Encounter (Signed)
 Multiple symptoms- headache, dizziness, neck pain- would like to be seen sooner than Monday- will send message to office - patient refuses UC- is flexible   FYI Only or Action Required?: Action required by provider: request for appointment.  Patient was last seen in primary care on 10/23/2023 by Willo Mini, NP.  Called Nurse Triage reporting Neck Pain (Soreness - facial pain, dizziness).  Symptoms began several days ago.  Interventions attempted: Nothing.  Symptoms are: unchanged.  Triage Disposition: See PCP When Office is Open (Within 3 Days)  Patient/caregiver understands and will follow disposition?: yes- patient scheduled first available- declines UC.  Reason for Disposition  [1] MODERATE dizziness (e.g., interferes with normal activities) AND [2] has been evaluated by doctor (or NP/PA) for this  Answer Assessment - Initial Assessment Questions 1. DESCRIPTION: Describe your dizziness.     Headache, lightheaded  2. LIGHTHEADED: Do you feel lightheaded? (e.g., somewhat faint, woozy, weak upon standing)     No- nausea 3. VERTIGO: Do you feel like either you or the room is spinning or tilting? (i.e., vertigo)     no 4. SEVERITY: How bad is it?  Do you feel like you are going to faint? Can you stand and walk?     Able to function 5. ONSET:  When did the dizziness begin?     5  days ago 6. AGGRAVATING FACTORS: Does anything make it worse? (e.g., standing, change in head position)     Not sure 7. HEART RATE: Can you tell me your heart rate? How many beats in 15 seconds?  (Note: Not all patients can do this.)       no 8. CAUSE: What do you think is causing the dizziness? (e.g., decreased fluids or food, diarrhea, emotional distress, heat exposure, new medicine, sudden standing, vomiting; unknown)     Comes and goes - stress 9. RECURRENT SYMPTOM: Have you had dizziness before? If Yes, ask: When was the last time? What happened that time?     Yes- did not  last as long 10. OTHER SYMPTOMS: Do you have any other symptoms? (e.g., fever, chest pain, vomiting, diarrhea, bleeding)       Neck pain, forehead/eye pain-almost like sinus pressure  Protocols used: Dizziness - Lightheadedness-A-AH   Copied from CRM #[id number]. Topic: Clinical - Red Word Triage >> Oct 30, 2023  9:27 AM Carrielelia G wrote: Red Word that prompted transfer to Nurse Triage: dizziness, neck pain, facial soreness (five days and not going away)

## 2023-10-30 NOTE — ED Provider Notes (Signed)
 [facility] Provider Note  CSN: [id number] Arrival date & time: 10/30/23 1510  Chief Complaint(s) Dizziness  HPI Natalie Day is a 43 y.o. female history of GERD presented to the emergency department with headache.  Patient reports headache to the posterior head, present for around a week.  She reports associated lightheadedness sensation, nausea.  No vomiting.  No head injury.  Has taken ibuprofen  which helps some but does not fully go away.  Does note significant increase stress lately related to quitting job.  No chest pain.  No numbness or tingling, weakness, vision changes, trouble walking.  Symptoms moderate.  Has primary doctor appointment on Monday.   Past Medical History Past Medical History:  Diagnosis Date   Abnormal Pap smear 2001   Dysplasia, Colpo/BX, LEEP   Amenorrhea    Chlamydia    GERD (gastroesophageal reflux disease)    HPV (human papilloma virus) infection    Infection    STD (sexually transmitted disease)    Trichimoniasis    Patient Active Problem List   Diagnosis Date Noted   Grief 09/29/2023   Upper back pain 05/14/2023   Well adult exam 03/13/2023   Left sided sciatica 09/25/2022   Left foot pain 09/16/2022   Left leg numbness 09/16/2022   Acute pain of left knee 09/16/2022   Abdominal pain 03/20/2022   Acute pharyngitis 10/10/2021   Zoster 05/09/2021   Eustachian tube dysfunction 10/08/2020   COVID-19 08/25/2020   GAD (generalized anxiety disorder) 12/07/2019   URI (upper respiratory infection) 10/21/2019   Class 1 obesity due to excess calories without serious comorbidity with body mass index (BMI) of 34.0 to 34.9 in adult 09/13/2019   Costochondritis 08/06/2018   Insomnia 07/24/2018   New onset of headaches 08/18/2017   SOB (shortness of breath) 09/05/2014   Cough 12/08/2013   History of loop electrical excision procedure (LEEP) 12/18/2012   IUD (intrauterine device) in place 12/18/2012    Irregular heart rhythm 10/17/2012   Tension headache 09/18/2011   GASTROESOPHAGEAL REFLUX, NO ESOPHAGITIS 05/15/2006   Home Medication(s) Prior to Admission medications   Medication Sig Start Date End Date Taking? Authorizing Provider  cetirizine  (ZYRTEC ) 10 MG chewable tablet Chew 1 tablet (10 mg total) by mouth in the morning and at bedtime. 06/18/23   Jeneal Danita Macintosh, MD  cyclobenzaprine  (FLEXERIL ) 10 MG tablet Take 1 tablet (10 mg total) by mouth 3 (three) times daily as needed for muscle spasms. 05/14/23   Alvia Bring, DO  famotidine  (PEPCID ) 20 MG tablet Take 1 tablet (20 mg total) by mouth 2 (two) times daily. 06/18/23   Jeneal Danita Macintosh, MD  hydrOXYzine  (ATARAX ) 25 MG tablet TAKE 0.5-1 TABLETS (12.5-25 MG TOTAL) BY MOUTH EVERY 8 (EIGHT) HOURS AS NEEDED FOR ANXIETY. 07/29/22   Alvia Bring, DO  pantoprazole  (PROTONIX ) 40 MG tablet TAKE 1 TABLET (40 MG TOTAL) BY MOUTH TWICE A DAY BEFORE MEALS 09/08/23   Alvia Bring, DO  sertraline  (ZOLOFT ) 25 MG tablet Take 1 tablet (25 mg total) by mouth daily. 09/29/23   Breeback, Jade L, PA-C  traZODone  (DESYREL ) 50 MG tablet Take 0.5-1 tablets (25-50 mg total) by mouth at bedtime as needed for sleep. 09/29/23   Breeback, Jade L, PA-C  Past Surgical History Past Surgical History:  Procedure Laterality Date   BREAST BIOPSY Right 10/11/2020   CERVICAL BIOPSY  W/ LOOP ELECTRODE EXCISION  2001   CESAREAN SECTION     COLPOSCOPY     UPPER GASTROINTESTINAL ENDOSCOPY  15 years ago    Family History Family History  Problem Relation Age of Onset   Breast cancer Mother 30       Invasive Lobular Carcinoma   Healthy Father    Colon cancer Neg Hx    Pancreatic cancer Neg Hx    Esophageal cancer Neg Hx    Allergic rhinitis Neg Hx    Angioedema Neg Hx    Asthma Neg Hx    Eczema Neg Hx    Urticaria Neg Hx      Social History Social History   Tobacco Use   Smoking status: Never    Passive exposure: Never   Smokeless tobacco: Never  Vaping Use   Vaping status: Never Used  Substance Use Topics   Alcohol use: No   Drug use: No   Allergies Patient has no known allergies.  Review of Systems Review of Systems  All other systems reviewed and are negative.   Physical Exam Vital Signs  I have reviewed the triage vital signs BP 124/77 (BP Location: Right Arm)   Pulse 80   Temp 98.4 F (36.9 C) (Oral)   Resp 18   Ht 5' 7 (1.702 m)   Wt 90.7 kg   LMP 03/18/2006   SpO2 99%   BMI 31.32 kg/m  Physical Exam Vitals and nursing note reviewed.  Constitutional:      General: She is not in acute distress.    Appearance: She is well-developed.  HENT:     Head: Normocephalic and atraumatic.     Mouth/Throat:     Mouth: Mucous membranes are moist.  Eyes:     Pupils: Pupils are equal, round, and reactive to light.  Cardiovascular:     Rate and Rhythm: Normal rate and regular rhythm.     Heart sounds: No murmur heard. Pulmonary:     Effort: Pulmonary effort is normal. No respiratory distress.     Breath sounds: Normal breath sounds.  Abdominal:     General: Abdomen is flat.     Palpations: Abdomen is soft.     Tenderness: There is no abdominal tenderness.  Musculoskeletal:        General: No tenderness.     Right lower leg: No edema.     Left lower leg: No edema.  Skin:    General: Skin is warm and dry.  Neurological:     General: No focal deficit present.     Mental Status: She is alert. Mental status is at baseline.     Comments: Cranial nerves II through XII intact, strength 5 out of 5 in the bilateral upper and lower extremities, no sensory deficit to light touch, no dysmetria on finger-nose-finger testing  Psychiatric:        Mood and Affect: Mood normal.        Behavior: Behavior normal.     ED Results and Treatments Labs (all labs ordered are listed, but only  abnormal results are displayed) Labs Reviewed  COMPREHENSIVE METABOLIC PANEL WITH GFR - Abnormal; Notable for the following components:      Result Value   Glucose, Bld 106 (*)    All other components within normal limits  CBC - Abnormal; Notable for the following components:  Platelets 136 (*)    All other components within normal limits  CBG MONITORING, ED - Abnormal; Notable for the following components:   Glucose-Capillary 114 (*)    All other components within normal limits                                                                                                                          Radiology No results found.  Pertinent labs & imaging results that were available during my care of the patient were reviewed by me and considered in my medical decision making (see MDM for details).  Medications Ordered in ED Medications  sodium chloride  0.9 % bolus 1,000 mL (1,000 mLs Intravenous New Bag/Given 10/30/23 1636)  prochlorperazine  (COMPAZINE ) injection 10 mg (10 mg Intravenous Given 10/30/23 1638)  diphenhydrAMINE  (BENADRYL ) injection 12.5 mg (12.5 mg Intravenous Given 10/30/23 1637)  ketorolac  (TORADOL ) 15 MG/ML injection 15 mg (15 mg Intravenous Given 10/30/23 1637)                                                                                                                                     Procedures Procedures  (including critical care time)  Medical Decision Making / ED Course   MDM:  43 year old presenting with headache.  Patient overall well-appearing, neurologic exam is nonfocal.  Symptoms seem most consistent with primary headache syndrome such as tension headache, migraine headache.  Will give headache cocktail.  Neurologic exam is reassuring, low concern for other dangerous process such as subarachnoid hemorrhage, brain mass, intracranial bleeding, venous sinus thrombosis, carbon monoxide poisoning or other dangerous problem.  Will give migraine cocktail  and reassess but anticipate patient will be stable for discharge.  Clinical Course as of 10/30/23 1656  Thu Oct 30, 2023  1646 Labs reassuring.  Patient reports her headache is resolved after treatment.  Advise follow-up with PMD. Will discharge patient to home. All questions answered. Patient comfortable with plan of discharge. Return precautions discussed with patient and specified on the after visit summary.  [WS]    Clinical Course User Index [WS] Francesca Elsie CROME, MD     Additional history obtained: -External records from outside source obtained and reviewed including: Chart review including previous notes, labs, imaging, consultation notes including prior PMD notes    Lab Tests: -I ordered, reviewed, and interpreted labs.   The pertinent results include:   Labs Reviewed  COMPREHENSIVE  METABOLIC PANEL WITH GFR - Abnormal; Notable for the following components:      Result Value   Glucose, Bld 106 (*)    All other components within normal limits  CBC - Abnormal; Notable for the following components:   Platelets 136 (*)    All other components within normal limits  CBG MONITORING, ED - Abnormal; Notable for the following components:   Glucose-Capillary 114 (*)    All other components within normal limits    Notable for mild hyperglycemia, nonspecific mild thrombocytopenia   Medicines ordered and prescription drug management: Meds ordered this encounter  Medications   sodium chloride  0.9 % bolus 1,000 mL   prochlorperazine  (COMPAZINE ) injection 10 mg   diphenhydrAMINE  (BENADRYL ) injection 12.5 mg   ketorolac  (TORADOL ) 15 MG/ML injection 15 mg    -I have reviewed the patients home medicines and have made adjustments as needed Social Determinants of Health:  Diagnosis or treatment significantly limited by social determinants of health: obesity   Reevaluation: After the interventions noted above, I reevaluated the patient and found that their symptoms have  resolved  Co morbidities that complicate the patient evaluation  Past Medical History:  Diagnosis Date   Abnormal Pap smear 2001   Dysplasia, Colpo/BX, LEEP   Amenorrhea    Chlamydia    GERD (gastroesophageal reflux disease)    HPV (human papilloma virus) infection    Infection    STD (sexually transmitted disease)    Trichimoniasis       Dispostion: Disposition decision including need for hospitalization was considered, and patient discharged from emergency department.    Final Clinical Impression(s) / ED Diagnoses Final diagnoses:  Acute nonintractable headache, unspecified headache type     This chart was dictated using voice recognition software.  Despite best efforts to proofread,  errors can occur which can change the documentation meaning.    Francesca Elsie CROME, MD 10/30/23 [phone]

## 2023-10-30 NOTE — ED Triage Notes (Signed)
 Pt reports dizziness with HA, nausea since Sat. Denies emesis or weakness No vision, speech, sensation changes

## 2023-10-30 NOTE — Discharge Instructions (Signed)
 We evaluated you for your headache.  Your testing in the emergency department including your blood tests and physical examination were reassuring.  Your headache may be due to a migraine or tension type headache.  These can be exacerbated by stress.  Please take Tylenol  (acetaminophen ) and Motrin  (ibuprofen ) for your symptoms at home.  You can take 1000 mg of Tylenol  every 6 hours and 600 mg of Motrin  every 6 hours as needed for your symptoms.  You can take these medicines together as needed, either at the same time, or alternating every 3 hours.  Please continue to follow-up with your primary doctor.  If you have any new or worsening symptoms such as numbness or tingling, weakness, severe lightheadedness, chest pain, difficulty breathing, fainting, or any other new symptoms, please return to the emergency department

## 2023-11-03 ENCOUNTER — Encounter: Payer: Self-pay | Admitting: Medical-Surgical

## 2023-11-03 ENCOUNTER — Ambulatory Visit: Admitting: Medical-Surgical

## 2023-11-03 VITALS — BP 109/71 | HR 62 | Resp 20 | Ht 67.0 in | Wt 202.0 lb

## 2023-11-03 DIAGNOSIS — R519 Headache, unspecified: Secondary | ICD-10-CM

## 2023-11-03 MED ORDER — RIZATRIPTAN BENZOATE 10 MG PO TBDP
10.0000 mg | ORAL_TABLET | ORAL | 3 refills | Status: AC | PRN
Start: 2023-11-03 — End: ?

## 2023-11-03 MED ORDER — ONDANSETRON HCL 4 MG PO TABS: 4.0000 mg | ORAL_TABLET | Freq: Three times a day (TID) | ORAL | 2 refills | Status: AC | PRN

## 2023-11-03 NOTE — Progress Notes (Signed)
        Established patient visit   History of Present Illness   Discussed the use of AI scribe software for clinical note transcription with the patient, who gave verbal consent to proceed.  Hospital discharge follow up - Seen in the ED with complaints of dizziness, HA, and nausea - Imaging and labs unremarkable - Treated with a migraine cocktail  (Compazine , Benadryl , Toradol ) with good relief of symptoms - Motion sickness medication at work provides some relief - Gradual improvement in symptoms, but persistent pain at the occipital region upon waking  Stress and anxiety - FMLA forms completed recently - Reports that her anxiety and mental health have improved since she decided to quit her job - Still working a part time job and is now working on finding a full time position  Physical Exam   Physical Exam Vitals reviewed.  Constitutional:      General: She is not in acute distress.    Appearance: Normal appearance. She is well-developed. She is not ill-appearing.  HENT:     Head: Normocephalic and atraumatic.  Cardiovascular:     Rate and Rhythm: Normal rate and regular rhythm.     Pulses: Normal pulses.     Heart sounds: Normal heart sounds. No murmur heard.    No friction rub. No gallop.  Pulmonary:     Effort: Pulmonary effort is normal. No respiratory distress.     Breath sounds: Normal breath sounds. No wheezing.  Skin:    General: Skin is warm and dry.  Neurological:     Mental Status: She is alert and oriented to person, place, and time.  Psychiatric:        Mood and Affect: Mood normal.        Behavior: Behavior normal.        Thought Content: Thought content normal.        Judgment: Judgment normal.     Assessment & Plan   Hospital discharge follow up Migraine with associated dizziness and nausea Migraine treated with hospital cocktail, temporary relief. Recurrence with gradual improvement. Dizziness managed with motion sickness medication. Slightly low  platelets noted. Stress may contribute. - Prescribed Maxalt  10 mg oral dissolving tablets for migraine, take at onset, repeat in 2 hours if needed, max 2 tablets/day. - Prescribed Zofran  for nausea, use as needed. - Advised to contact if no improvement or symptoms worsen.  Follow up   Return if symptoms worsen or fail to improve.  __________________________________ Natalie FREDRIK Palin, DNP, APRN, FNP-BC Primary Care and Sports Medicine [facility]

## 2023-11-06 ENCOUNTER — Encounter: Payer: Self-pay | Admitting: Family Medicine

## 2023-11-06 ENCOUNTER — Telehealth: Payer: Self-pay

## 2023-11-06 NOTE — Telephone Encounter (Signed)
 Copied from CRM [id number]. Topic: Clinical - Medication Question >> Nov 06, 2023 12:07 PM Diannia H wrote: Reason for CRM: Patient is calling because she came in to see Zada Hales and she gave her some medicine but its not working. She is thinking she needs a cat scan but her insurance will end at the end of August. She is still in pain and its not getting any better with the medicine. Could you please assist? Patients callback number is [phone].

## 2023-11-07 ENCOUNTER — Other Ambulatory Visit: Payer: Self-pay | Admitting: Family Medicine

## 2023-11-07 DIAGNOSIS — R519 Headache, unspecified: Secondary | ICD-10-CM

## 2023-11-11 ENCOUNTER — Ambulatory Visit (INDEPENDENT_AMBULATORY_CARE_PROVIDER_SITE_OTHER)

## 2023-11-11 DIAGNOSIS — R519 Headache, unspecified: Secondary | ICD-10-CM

## 2023-11-12 ENCOUNTER — Ambulatory Visit: Payer: Self-pay | Admitting: Family Medicine

## 2023-11-13 NOTE — Telephone Encounter (Signed)
 Am I o.k. to use a same day slot next week ? Or did you want to see patient this week for evaluation?

## 2023-11-13 NOTE — Telephone Encounter (Signed)
 Patient scheduled tomorrow 11/14/2023 at 8:30am

## 2023-11-14 ENCOUNTER — Ambulatory Visit: Admitting: Family Medicine

## 2023-11-14 ENCOUNTER — Encounter: Payer: Self-pay | Admitting: Family Medicine

## 2023-11-14 VITALS — BP 114/75 | HR 55 | Ht 67.0 in | Wt 204.0 lb

## 2023-11-14 DIAGNOSIS — R519 Headache, unspecified: Secondary | ICD-10-CM | POA: Diagnosis not present

## 2023-11-14 NOTE — Progress Notes (Signed)
 Natalie Day - 43 y.o. female MRN [id number]  Date of birth: 05-07-1980  Subjective Chief Complaint  Patient presents with   Nausea   Headache    HPI Natalie Day is a 43 y.o. female here today for follow up visit.     She had been experiencing headache associated with nausea and some dizziness.  Seen on 8/18 and treated for migraine.  Had continued headache and CT scan w/o contrast of head ordered which was normal.  Today she reports that has been without symptoms for the past 4 days but felt like she may be starting to have a migraine today so she took maxalt .  Maxalt  has been effective for these episodes.  Zofran  has helped with nausea.  She denies fevers, chills, abdominal pain, worsening reflux, vision changes or other neurological symptoms.   ROS:  A comprehensive ROS was completed and negative except as noted per HPI  No Known Allergies  Past Medical History:  Diagnosis Date   Abnormal Pap smear 2001   Dysplasia, Colpo/BX, LEEP   Amenorrhea    Chlamydia    GERD (gastroesophageal reflux disease)    History of loop electrical excision procedure (LEEP) 12/18/2012   2001      HPV (human papilloma virus) infection    Infection    STD (sexually transmitted disease)    Trichimoniasis     Past Surgical History:  Procedure Laterality Date   BREAST BIOPSY Right 10/11/2020   CERVICAL BIOPSY  W/ LOOP ELECTRODE EXCISION  2001   CESAREAN SECTION     COLPOSCOPY     UPPER GASTROINTESTINAL ENDOSCOPY  15 years ago     Social History   Socioeconomic History   Marital status: Single    Spouse name: Not on file   Number of children: 1   Years of education: Masters   Highest education level: Not on file  Occupational History   Occupation: [employer]   Tobacco Use   Smoking status: Never    Passive exposure: Never   Smokeless tobacco: Never  Vaping Use   Vaping status: Never Used  Substance and Sexual Activity   Alcohol use: No   Drug use: No   Sexual activity: Yes     Partners: Male    Birth control/protection: I.U.D.    Comment: Mirena  Other Topics Concern   Not on file  Social History Narrative   Lives alone   Caffeine use: soda/tea 3x/day   Right   Handed   Social Drivers of Health   Financial Resource Strain: Not on file  Food Insecurity: No Food Insecurity (03/17/2022)   Received from [facility]   Hunger Vital Sign    Within the past 12 months, you worried that your food would run out before you got the money to buy more.: Never true    Within the past 12 months, the food you bought just didn't last and you didn't have money to get more.: Never true  Transportation Needs: Not on file  Physical Activity: Not on file  Stress: Not on file  Social Connections: Unknown (07/23/2021)   [facility]   Social Network    Social Network: Not on file    Family History  Problem Relation Age of Onset   Breast cancer Mother 67       Invasive Lobular Carcinoma   Healthy Father    Colon cancer Neg Hx    Pancreatic cancer Neg Hx    Esophageal cancer Neg  Hx    Allergic rhinitis Neg Hx    Angioedema Neg Hx    Asthma Neg Hx    Eczema Neg Hx    Urticaria Neg Hx     Health Maintenance  Topic Date Due   Hepatitis C Screening  Never done   Hepatitis B Vaccines 19-59 Average Risk (1 of 3 - 19+ 3-dose series) Never done   HPV VACCINES (1 - 3-dose SCDM series) Never done   COVID-19 Vaccine (5 - 2024-25 season) 11/17/2022   Colonoscopy  Never done   Cervical Cancer Screening (HPV/Pap Cotest)  11/22/2023   INFLUENZA VACCINE  06/15/2024 (Originally 10/17/2023)   DTaP/Tdap/Td (3 - Td or Tdap) 09/07/2029   HIV Screening  Completed   Pneumococcal Vaccine  Aged Out   Meningococcal B Vaccine  Aged Out     ----------------------------------------------------------------------------------------------------------------------------------------------------------------------------------------------------------------- Physical Exam BP 114/75  (BP Location: Left Arm, Patient Position: Sitting, Cuff Size: Large)   Pulse (!) 55   Ht 5' 7 (1.702 m)   Wt 204 lb (92.5 kg)   LMP 03/18/2006   SpO2 99%   BMI 31.95 kg/m   Physical Exam Constitutional:      Appearance: Normal appearance.  Cardiovascular:     Rate and Rhythm: Normal rate and regular rhythm.  Pulmonary:     Effort: Pulmonary effort is normal.     Breath sounds: Normal breath sounds.  Neurological:     General: No focal deficit present.     Mental Status: She is alert.  Psychiatric:        Mood and Affect: Mood normal.        Behavior: Behavior normal.     ------------------------------------------------------------------------------------------------------------------------------------------------------------------------------------------------------------------- Assessment and Plan  New onset of headaches Headaches seem to be improved at this time.  May continue maxalt  prn with zofran  prn for nausea.  Red flags reviewed.  If headaches increase in frequency will plan trial of topiramate.     No orders of the defined types were placed in this encounter.   No follow-ups on file.

## 2023-11-14 NOTE — Patient Instructions (Signed)
 Stay well hydrated.  Get plenty of sleep.  Let me know if headaches return.

## 2023-11-14 NOTE — Assessment & Plan Note (Signed)
 Headaches seem to be improved at this time.  May continue maxalt  prn with zofran  prn for nausea.  Red flags reviewed.  If headaches increase in frequency will plan trial of topiramate.

## 2024-01-21 ENCOUNTER — Encounter: Payer: Self-pay | Admitting: Allergy

## 2024-01-21 ENCOUNTER — Other Ambulatory Visit: Payer: Self-pay

## 2024-01-21 ENCOUNTER — Ambulatory Visit: Admitting: Allergy

## 2024-01-21 VITALS — BP 128/86 | HR 82 | Temp 98.3°F | Resp 18

## 2024-01-21 DIAGNOSIS — L503 Dermatographic urticaria: Secondary | ICD-10-CM

## 2024-01-21 DIAGNOSIS — L299 Pruritus, unspecified: Secondary | ICD-10-CM

## 2024-01-21 DIAGNOSIS — L501 Idiopathic urticaria: Secondary | ICD-10-CM

## 2024-01-21 MED ORDER — MONTELUKAST SODIUM 10 MG PO TABS: 10.0000 mg | ORAL_TABLET | Freq: Every day | ORAL | 5 refills | Status: AC

## 2024-01-21 MED ORDER — FAMOTIDINE 20 MG PO TABS: 20.0000 mg | ORAL_TABLET | Freq: Two times a day (BID) | ORAL | 5 refills | Status: AC

## 2024-01-21 NOTE — Progress Notes (Unsigned)
 Follow-up Note  RE: Natalie Day MRN: [id number] DOB: [birth year] Date of Office Visit: 01/21/2024   History of present illness: Natalie Day is a 43 y.o. female presenting today for follow-up of chronic urticarial dermatitis with pruritus.  She was last seen in the office on 06/18/2023 by myself.  Discussed the use of AI scribe software for clinical note transcription with the patient, who gave verbal consent to proceed.  She has been experiencing chronic itching and rash since April, with symptoms described as fluctuating and particularly bothersome in the last week. She uses Benadryl  at night for relief, but continues to take Zyrtec  and Pepcid  twice daily, which have not fully controlled her symptoms.  Flare-ups occur approximately once a month, described as unpredictable and random. During these episodes, the itching becomes severe, prompting the use of Benadryl , which provides temporary relief lasting about four to six hours. Her current medication regimen includes Zyrtec  twice daily, Pepcid  twice daily, and pantoprazole  twice daily.  In the past, she underwent an environmental allergy panel, which showed slight reactivity to tree and weed pollen. Other tests, including a chronic hive panel and thyroid  studies, were normal.  Review of systems: 10pt ROS negative unless noted above in HPI  Past medical/social/surgical/family history have been reviewed and are unchanged unless specifically indicated below.  No changes  Medication List: Current Outpatient Medications  Medication Sig Dispense Refill   cetirizine  (ZYRTEC ) 10 MG chewable tablet Chew 1 tablet (10 mg total) by mouth in the morning and at bedtime. 60 tablet 2   hydrOXYzine  (ATARAX ) 25 MG tablet TAKE 0.5-1 TABLETS (12.5-25 MG TOTAL) BY MOUTH EVERY 8 (EIGHT) HOURS AS NEEDED FOR ANXIETY. 30 tablet 0   montelukast (SINGULAIR) 10 MG tablet Take 1 tablet (10 mg total) by mouth at bedtime. 30 tablet 5   ondansetron  (ZOFRAN ) 4  MG tablet Take 1 tablet (4 mg total) by mouth every 8 (eight) hours as needed for nausea or vomiting. 20 tablet 2   pantoprazole  (PROTONIX ) 40 MG tablet TAKE 1 TABLET (40 MG TOTAL) BY MOUTH TWICE A DAY BEFORE MEALS 180 tablet 1   rizatriptan  (MAXALT -MLT) 10 MG disintegrating tablet Take 1 tablet (10 mg total) by mouth as needed for migraine. May repeat in 2 hours if needed 10 tablet 3   sertraline  (ZOLOFT ) 25 MG tablet Take 1 tablet (25 mg total) by mouth daily. 30 tablet 1   traZODone  (DESYREL ) 50 MG tablet Take 0.5-1 tablets (25-50 mg total) by mouth at bedtime as needed for sleep. 30 tablet 1   famotidine  (PEPCID ) 20 MG tablet Take 1 tablet (20 mg total) by mouth 2 (two) times daily. 60 tablet 5   No current facility-administered medications for this visit.     Known medication allergies: No Known Allergies   Physical examination: Blood pressure 128/86, pulse 82, temperature 98.3 F (36.8 C), resp. rate 18, last menstrual period 03/18/2006, SpO2 96%.  General: Alert, interactive, in no acute distress. Neck: Supple without lymphadenopathy. Lungs: Clear to auscultation without wheezing, rhonchi or rales. {no increased work of breathing. CV: Normal S1, S2 without murmurs. Abdomen: Nondistended, nontender. Skin: Warm and dry, without lesions or rashes. Extremities:  No clubbing, cyanosis or edema. Neuro:   Grossly intact.  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 No clear triggers identified. Current management includes Zyrtec , Benadryl , and hydroxyzine .  High-dose antihistamine regimen recommended. Potential escalation to Singulair or Xolair if  symptoms persist. Workup planned for underlying causes. - Continue antihistamine regimen with Zyrtec  twice daily and Pepcid  twice daily. - Start Singulair (Montelukast) 10mg  daily at bedtime.  This is an antileukotriene  that can help alongside antihistamines for better control of hive/itch symptoms.  If you notice any change in mood/behavior/sleep after starting Singulair then stop this medication and let us  know.  Symptoms resolve after stopping the medication.  - labs were reassuring only showing allergy signals for tree and weed pollens.  - Consider Xolair monthly injections or Rhapsido twice a day oral tab for control of symptoms if symptoms persist after Singulair - Avoid hydroxyzine  and Benadryl  unless absolutely necessary due to sedative effects but would prefer hydroxyzine  over benadryl  if needed.  Follow-up in 3 months or sooner if needed  I appreciate the opportunity to take part in Natalie Day's care. Please do not hesitate to contact me with questions.  Sincerely,   Danita Brain, MD Allergy/Immunology [facility]

## 2024-01-21 NOTE — Patient Instructions (Addendum)
 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 No clear triggers identified. Current management includes Zyrtec , Benadryl , and hydroxyzine .  High-dose antihistamine regimen recommended. Potential escalation to Singulair or Xolair if symptoms persist. Workup planned for underlying causes. - Continue antihistamine regimen with Zyrtec  twice daily and Pepcid  twice daily. - Start Singulair (Montelukast) 10mg  daily at bedtime.  This is an antileukotriene that can help alongside antihistamines for better control of hive/itch symptoms.  If you notice any change in mood/behavior/sleep after starting Singulair then stop this medication and let us  know.  Symptoms resolve after stopping the medication.  - labs were reassuring only showing allergy signals for tree and weed pollens.  - Consider Xolair monthly injections or Rhapsido twice a day oral tab for control of symptoms if symptoms persist after Singulair - Avoid hydroxyzine  and Benadryl  unless absolutely necessary due to sedative effects but would prefer hydroxyzine  over benadryl  if needed.  Follow-up in 3 months or sooner if needed

## 2024-01-29 ENCOUNTER — Encounter: Payer: Self-pay | Admitting: Family Medicine

## 2024-01-29 ENCOUNTER — Telehealth (INDEPENDENT_AMBULATORY_CARE_PROVIDER_SITE_OTHER): Admitting: Family Medicine

## 2024-01-29 VITALS — Ht 67.0 in | Wt 204.0 lb

## 2024-01-29 DIAGNOSIS — J069 Acute upper respiratory infection, unspecified: Secondary | ICD-10-CM | POA: Diagnosis not present

## 2024-01-29 MED ORDER — BENZONATATE 200 MG PO CAPS: 200.0000 mg | ORAL_CAPSULE | Freq: Two times a day (BID) | ORAL | 0 refills | Status: DC | PRN

## 2024-01-29 MED ORDER — HYDROCODONE BIT-HOMATROP MBR 5-1.5 MG/5ML PO SOLN: 5.0000 mL | Freq: Three times a day (TID) | ORAL | 0 refills | Status: DC | PRN

## 2024-01-29 NOTE — Telephone Encounter (Signed)
 Patient informed but states she had seen dr. Alvia this morning.

## 2024-01-29 NOTE — Progress Notes (Signed)
 Sx's started last night. Post nasal drip causing sore throat. Tessalon  expired. Cough med expired.

## 2024-01-29 NOTE — Telephone Encounter (Signed)
 Done

## 2024-01-29 NOTE — Assessment & Plan Note (Signed)
 Continue supportive care with increased fluids.  Renewal of tessalon  and hycodan syrup sent in.

## 2024-01-29 NOTE — Progress Notes (Signed)
 Natalie Day - 43 y.o. female MRN [id number]  Date of birth: 1980-10-18   All issues noted in this document were discussed and addressed.  No physical exam was performed (except for noted visual exam findings with Video Visits).  I discussed the limitations of evaluation and management by telemedicine and the availability of in person appointments. The patient expressed understanding and agreed to proceed.  I connected withNAME@ on 01/29/24 at  9:30 AM EST by a video enabled telemedicine application and verified that I am speaking with the correct person using two identifiers.  Present at visit: Natalie Ku, DO Natalie Day   Patient Location: Home [address] [place] [address]   Provider location:   [facility]  Chief Complaint  Patient presents with   Sore Throat   Nasal Congestion    HPI  Natalie Day is a 43 y.o. female who presents via audio/video conferencing for a telehealth visit today.  She reports that she is having increased drainage that his causing cough.  Denies fever, chills, body aches, wheezing or dyspnea.  She has used tessalon  and rx cough syrup previously which worked well but this has expired.  She is requesting a renewal of this.    ROS:  A comprehensive ROS was completed and negative except as noted per HPI  Past Medical History:  Diagnosis Date   Abnormal Pap smear 2001   Dysplasia, Colpo/BX, LEEP   Amenorrhea    Chlamydia    GERD (gastroesophageal reflux disease)    History of loop electrical excision procedure (LEEP) 12/18/2012   2001      HPV (human papilloma virus) infection    Infection    STD (sexually transmitted disease)    Trichimoniasis     Past Surgical History:  Procedure Laterality Date   BREAST BIOPSY Right 10/11/2020   CERVICAL BIOPSY  W/ LOOP ELECTRODE EXCISION  2001   CESAREAN SECTION     COLPOSCOPY     UPPER GASTROINTESTINAL ENDOSCOPY  15 years ago     Family History  Problem Relation Age of Onset    Breast cancer Mother 6       Invasive Lobular Carcinoma   Healthy Father    Colon cancer Neg Hx    Pancreatic cancer Neg Hx    Esophageal cancer Neg Hx    Allergic rhinitis Neg Hx    Angioedema Neg Hx    Asthma Neg Hx    Eczema Neg Hx    Urticaria Neg Hx     Social History   Socioeconomic History   Marital status: Single    Spouse name: Not on file   Number of children: 1   Years of education: Masters   Highest education level: Not on file  Occupational History   Occupation:  a [employer]   Tobacco Use   Smoking status: Never    Passive exposure: Never   Smokeless tobacco: Never  Vaping Use   Vaping status: Never Used  Substance and Sexual Activity   Alcohol use: No   Drug use: No   Sexual activity: Yes    Partners: Male    Birth control/protection: I.U.D.    Comment: Mirena  Other Topics Concern   Not on file  Social History Narrative   Lives alone   Caffeine use: soda/tea 3x/day   Right   Handed   Social Drivers of Health   Financial Resource Strain: Not on file  Food Insecurity: No Food Insecurity (03/17/2022)   Received from [facility]   Hunger Vital Sign    Within the past 12 months, the food you bought just didn't last and you didn't have money to get more.: Never true    Within the past 12 months, you worried that your food would run out before you got the money to buy more.: Never true  Transportation Needs: Not on file  Physical Activity: Not on file  Stress: Not on file  Social Connections: Not on file  Intimate Partner Violence: Not on file     Current Outpatient Medications:    cetirizine  (ZYRTEC ) 10 MG chewable tablet, Chew 1 tablet (10 mg total) by mouth in the morning and at bedtime., Disp: 60 tablet, Rfl: 2   famotidine  (PEPCID ) 20 MG tablet, Take 1 tablet (20 mg total) by mouth 2 (two) times daily., Disp: 60 tablet, Rfl: 5   hydrOXYzine  (ATARAX ) 25 MG tablet, TAKE 0.5-1 TABLETS (12.5-25 MG TOTAL) BY MOUTH EVERY 8 (EIGHT) HOURS AS NEEDED  FOR ANXIETY., Disp: 30 tablet, Rfl: 0   montelukast (SINGULAIR) 10 MG tablet, Take 1 tablet (10 mg total) by mouth at bedtime., Disp: 30 tablet, Rfl: 5   ondansetron  (ZOFRAN ) 4 MG tablet, Take 1 tablet (4 mg total) by mouth every 8 (eight) hours as needed for nausea or vomiting., Disp: 20 tablet, Rfl: 2   pantoprazole  (PROTONIX ) 40 MG tablet, TAKE 1 TABLET (40 MG TOTAL) BY MOUTH TWICE A DAY BEFORE MEALS, Disp: 180 tablet, Rfl: 1   rizatriptan  (MAXALT -MLT) 10 MG disintegrating tablet, Take 1 tablet (10 mg total) by mouth as needed for migraine. May repeat in 2 hours if needed, Disp: 10 tablet, Rfl: 3   sertraline  (ZOLOFT ) 25 MG tablet, Take 1 tablet (25 mg total) by mouth daily., Disp: 30 tablet, Rfl: 1   traZODone  (DESYREL ) 50 MG tablet, Take 0.5-1 tablets (25-50 mg total) by mouth at bedtime as needed for sleep., Disp: 30 tablet, Rfl: 1  EXAM:  VITALS per patient if applicable: Ht 5' 7 (1.702 m)   Wt 204 lb (92.5 kg)   LMP 03/18/2006   BMI 31.95 kg/m   GENERAL: alert, oriented, appears well and in no acute distress  HEENT: atraumatic, conjunttiva clear, no obvious abnormalities on inspection of external nose and ears  NECK: normal movements of the head and neck  LUNGS: on inspection no signs of respiratory distress, breathing rate appears normal, no obvious gross SOB, gasping or wheezing  CV: no obvious cyanosis  MS: moves all visible extremities without noticeable abnormality  PSYCH/NEURO: pleasant and cooperative, no obvious depression or anxiety, speech and thought processing grossly intact  ASSESSMENT AND PLAN:  Discussed the following assessment and plan:  URI with cough and congestion Continue supportive care with increased fluids.  Renewal of tessalon  and hycodan syrup sent in.      I discussed the assessment and treatment plan with the patient. The patient was provided an opportunity to ask questions and all were answered. The patient agreed with the plan and  demonstrated an understanding of the instructions.   The patient was advised to call back or seek an in-person evaluation if the symptoms worsen or if the condition fails to improve as anticipated.    Natalie Ku, DO

## 2024-02-05 ENCOUNTER — Encounter: Payer: Self-pay | Admitting: Family Medicine

## 2024-02-05 MED ORDER — HYDROCODONE BIT-HOMATROP MBR 5-1.5 MG/5ML PO SOLN: 5.0000 mL | Freq: Three times a day (TID) | ORAL | 0 refills | Status: AC | PRN

## 2024-02-19 ENCOUNTER — Ambulatory Visit: Payer: Self-pay

## 2024-02-19 ENCOUNTER — Ambulatory Visit: Admitting: Family Medicine

## 2024-02-19 ENCOUNTER — Ambulatory Visit

## 2024-02-19 ENCOUNTER — Encounter: Payer: Self-pay | Admitting: Family Medicine

## 2024-02-19 VITALS — BP 129/82 | HR 64 | Temp 97.7°F | Ht 67.0 in | Wt 211.0 lb

## 2024-02-19 DIAGNOSIS — R052 Subacute cough: Secondary | ICD-10-CM | POA: Insufficient documentation

## 2024-02-19 MED ORDER — DOXYCYCLINE HYCLATE 100 MG PO TABS: 100.0000 mg | ORAL_TABLET | Freq: Two times a day (BID) | ORAL | 0 refills | Status: AC

## 2024-02-19 MED ORDER — BENZONATATE 200 MG PO CAPS: 200.0000 mg | ORAL_CAPSULE | Freq: Three times a day (TID) | ORAL | 1 refills | Status: AC | PRN

## 2024-02-19 MED ORDER — PREDNISONE 20 MG PO TABS: 20.0000 mg | ORAL_TABLET | Freq: Two times a day (BID) | ORAL | 0 refills | Status: AC

## 2024-02-19 NOTE — Progress Notes (Signed)
 Natalie Day - 43 y.o. female MRN [id number]  Date of birth: [birth year]  Subjective Chief Complaint  Patient presents with   Cough    HPI Natalie Day is a 43 y.o. female seen in follow-up of cough.  Continues to have productive cough.  She has had this for few weeks at this point.  Sputum is thick and yellow-green appearing.  Denies wheezing or dyspnea.  She has been using prescription cough syrup as well as Tessalon  with some improvement.  Denies fever or chills.  ROS:  A comprehensive ROS was completed and negative except as noted per HPI  No Known Allergies  Past Medical History:  Diagnosis Date   Abnormal Pap smear 2001   Dysplasia, Colpo/BX, LEEP   Amenorrhea    Chlamydia    GERD (gastroesophageal reflux disease)    History of loop electrical excision procedure (LEEP) 12/18/2012   2001      HPV (human papilloma virus) infection    Infection    STD (sexually transmitted disease)    Trichimoniasis     Past Surgical History:  Procedure Laterality Date   BREAST BIOPSY Right 10/11/2020   CERVICAL BIOPSY  W/ LOOP ELECTRODE EXCISION  2001   CESAREAN SECTION     COLPOSCOPY     UPPER GASTROINTESTINAL ENDOSCOPY  15 years ago     Social History   Socioeconomic History   Marital status: Single    Spouse name: Not on file   Number of children: 1   Years of education: Masters   Highest education level: Not on file  Occupational History   Occupation: [employer] a [employer]   Tobacco Use   Smoking status: Never    Passive exposure: Never   Smokeless tobacco: Never  Vaping Use   Vaping status: Never Used  Substance and Sexual Activity   Alcohol use: No   Drug use: No   Sexual activity: Yes    Partners: Male    Birth control/protection: I.U.D.    Comment: Mirena  Other Topics Concern   Not on file  Social History Narrative   Lives alone   Caffeine use: soda/tea 3x/day   Right   Handed   Social Drivers of Health   Financial Resource Strain: Not on file  Food  Insecurity: No Food Insecurity (03/17/2022)   Received from [facility]   Hunger Vital Sign    Within the past 12 months, the food you bought just didn't last and you didn't have money to get more.: Never true    Within the past 12 months, you worried that your food would run out before you got the money to buy more.: Never true  Transportation Needs: Not on file  Physical Activity: Not on file  Stress: Not on file  Social Connections: Not on file    Family History  Problem Relation Age of Onset   Breast cancer Mother 39       Invasive Lobular Carcinoma   Healthy Father    Colon cancer Neg Hx    Pancreatic cancer Neg Hx    Esophageal cancer Neg Hx    Allergic rhinitis Neg Hx    Angioedema Neg Hx    Asthma Neg Hx    Eczema Neg Hx    Urticaria Neg Hx     Health Maintenance  Topic Date Due   Hepatitis C Screening  Never done   Hepatitis B Vaccines 19-59 Average Risk (1 of 3 - 19+ 3-dose series) Never done   HPV  VACCINES (1 - 3-dose SCDM series) Never done   Colonoscopy  Never done   COVID-19 Vaccine (5 - 2025-26 season) 11/17/2023   Cervical Cancer Screening (HPV/Pap Cotest)  11/22/2023   Influenza Vaccine  06/15/2024 (Originally 10/17/2023)   Mammogram  09/23/2025   DTaP/Tdap/Td (3 - Td or Tdap) 09/07/2029   HIV Screening  Completed   Pneumococcal Vaccine  Aged Out   Meningococcal B Vaccine  Aged Out     ----------------------------------------------------------------------------------------------------------------------------------------------------------------------------------------------------------------- Physical Exam BP 129/82   Pulse 64   Temp 97.7 F (36.5 C)   Ht 5' 7 (1.702 m)   Wt 211 lb (95.7 kg)   LMP 03/18/2006   SpO2 100%   BMI 33.05 kg/m   Physical Exam Constitutional:      Appearance: Normal appearance.  Eyes:     General: No scleral icterus. Cardiovascular:     Rate and Rhythm: Normal rate and regular rhythm.  Pulmonary:     Effort:  Pulmonary effort is normal.     Breath sounds: Normal breath sounds.  Musculoskeletal:     Cervical back: Neck supple.  Neurological:     Mental Status: She is alert.  Psychiatric:        Mood and Affect: Mood normal.        Behavior: Behavior normal.     ------------------------------------------------------------------------------------------------------------------------------------------------------------------------------------------------------------------- Assessment and Plan  Subacute cough 2 view chest x-ray ordered.  Adding course of doxycycline and prednisone  burst.  Recommend continued supportive care.  Tessalon  renewed.  She will let me know if symptoms persist.   Meds ordered this encounter  Medications   predniSONE  (DELTASONE ) 20 MG tablet    Sig: Take 1 tablet (20 mg total) by mouth 2 (two) times daily with a meal.    Dispense:  10 tablet    Refill:  0   doxycycline (VIBRA-TABS) 100 MG tablet    Sig: Take 1 tablet (100 mg total) by mouth 2 (two) times daily.    Dispense:  20 tablet    Refill:  0   benzonatate  (TESSALON ) 200 MG capsule    Sig: Take 1 capsule (200 mg total) by mouth 3 (three) times daily as needed for cough.    Dispense:  30 capsule    Refill:  1    No follow-ups on file.

## 2024-02-19 NOTE — Telephone Encounter (Signed)
 FYI Only or Action Required?: FYI only for provider: appointment scheduled on 02/19/2024 at 11:30am with patient's PCP Velma Ku, DO.  Patient was last seen in primary care on 01/29/2024 by Ku Velma, DO.  Called Nurse Triage reporting Cough.  Symptoms began about a month ago.  Interventions attempted: OTC medications: Delsym, Mucinex , , Prescription medications: benzonatate  (TESSALON ) 200 MG capsule, HYDROcodone  bit-homatropine (HYCODAN) 5-1.5 MG/5ML syrup, and Rest, hydration, or home remedies.  Symptoms are: gradually worsening.  Triage Disposition: See HCP Within 4 Hours (Or PCP Triage)  Patient/caregiver understands and will follow disposition?: Yes             Copied from CRM #[id number]. Topic: Clinical - Red Word Triage >> Feb 19, 2024  8:24 AM Avram MATSU wrote: Red Word that prompted transfer to Nurse Triage: coughing really bad. chest burning and hurting, mucus went to chest Reason for Disposition  [1] MILD difficulty breathing (e.g., minimal/no SOB at rest, SOB with walking, pulse < 100) AND [2] still present when not coughing  Answer Assessment - Initial Assessment Questions Patient reached out to her provider on --he prescribed tessalon  pearls and cough syrup --advised to take Mucinex  as well --x 4 weeks 97% oxygen saturation last night Denies any lung history, fevers, vomiting, diarrhea Patient added that she feels a little light headed off and on but denies being off balance She states that she does not have any difficulty breathing or any pain when she takes a deep breath but does state she has a burning in the center of the chest and she was concerned for possible pneumonia at this point No chance of pregnancy No known fevers  Patient is advised to call us  back if anything changes or with any further questions/concerns. Patient is advised that if anything worsens to go to the Emergency Room. Patient verbalized understanding.  Protocols used: Cough  - Acute Productive-A-AH

## 2024-02-19 NOTE — Telephone Encounter (Signed)
 Patient chart shows scheduled for 02/19/2024 with Dr. Alvia.

## 2024-02-19 NOTE — Assessment & Plan Note (Signed)
 2 view chest x-ray ordered.  Adding course of doxycycline and prednisone  burst.  Recommend continued supportive care.  Tessalon  renewed.  She will let me know if symptoms persist.

## 2024-02-23 ENCOUNTER — Ambulatory Visit: Payer: Self-pay | Admitting: Family Medicine

## 2024-03-01 MED ORDER — NYSTATIN 100000 UNIT/ML MT SUSP: 500000.0000 [IU] | Freq: Four times a day (QID) | OROMUCOSAL | 0 refills | Status: AC

## 2024-04-15 ENCOUNTER — Ambulatory Visit: Admitting: Allergy
